# Patient Record
Sex: Female | Born: 1980 | Race: Black or African American | Hispanic: No | Marital: Married | Smoking: Never smoker
Health system: Southern US, Community
[De-identification: ages and names within clinical notes are randomized; demographics above are authoritative.]

## PROBLEM LIST (undated history)

## (undated) ENCOUNTER — Inpatient Hospital Stay (HOSPITAL_COMMUNITY): Payer: Self-pay

## (undated) DIAGNOSIS — I10 Essential (primary) hypertension: Secondary | ICD-10-CM

## (undated) DIAGNOSIS — B999 Unspecified infectious disease: Secondary | ICD-10-CM

## (undated) DIAGNOSIS — IMO0002 Reserved for concepts with insufficient information to code with codable children: Secondary | ICD-10-CM

## (undated) DIAGNOSIS — N390 Urinary tract infection, site not specified: Secondary | ICD-10-CM

## (undated) DIAGNOSIS — D649 Anemia, unspecified: Secondary | ICD-10-CM

## (undated) DIAGNOSIS — D219 Benign neoplasm of connective and other soft tissue, unspecified: Secondary | ICD-10-CM

## (undated) HISTORY — PX: WISDOM TOOTH EXTRACTION: SHX21

---
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
## 1999-01-14 ENCOUNTER — Inpatient Hospital Stay (HOSPITAL_COMMUNITY): Admission: AD | Admit: 1999-01-14 | Discharge: 1999-01-14 | Payer: Self-pay | Admitting: *Deleted

## 1999-02-18 ENCOUNTER — Inpatient Hospital Stay (HOSPITAL_COMMUNITY): Admission: AD | Admit: 1999-02-18 | Discharge: 1999-02-18 | Payer: Self-pay | Admitting: *Deleted

## 1999-03-22 ENCOUNTER — Inpatient Hospital Stay (HOSPITAL_COMMUNITY): Admission: AD | Admit: 1999-03-22 | Discharge: 1999-03-25 | Payer: Self-pay | Admitting: *Deleted

## 2000-10-23 DIAGNOSIS — IMO0002 Reserved for concepts with insufficient information to code with codable children: Secondary | ICD-10-CM

## 2000-10-23 HISTORY — DX: Reserved for concepts with insufficient information to code with codable children: IMO0002

## 2001-07-10 ENCOUNTER — Emergency Department (HOSPITAL_COMMUNITY): Admission: EM | Admit: 2001-07-10 | Discharge: 2001-07-10 | Payer: Self-pay | Admitting: Emergency Medicine

## 2002-05-23 ENCOUNTER — Inpatient Hospital Stay (HOSPITAL_COMMUNITY): Admission: AD | Admit: 2002-05-23 | Discharge: 2002-05-23 | Payer: Self-pay | Admitting: Obstetrics and Gynecology

## 2002-07-01 ENCOUNTER — Other Ambulatory Visit: Admission: RE | Admit: 2002-07-01 | Discharge: 2002-07-01 | Payer: Self-pay | Admitting: Obstetrics and Gynecology

## 2002-08-22 ENCOUNTER — Inpatient Hospital Stay (HOSPITAL_COMMUNITY): Admission: AD | Admit: 2002-08-22 | Discharge: 2002-08-22 | Payer: Self-pay | Admitting: Obstetrics and Gynecology

## 2002-09-01 ENCOUNTER — Inpatient Hospital Stay (HOSPITAL_COMMUNITY): Admission: AD | Admit: 2002-09-01 | Discharge: 2002-09-01 | Payer: Self-pay | Admitting: Obstetrics and Gynecology

## 2002-09-02 ENCOUNTER — Inpatient Hospital Stay (HOSPITAL_COMMUNITY): Admission: AD | Admit: 2002-09-02 | Discharge: 2002-09-02 | Payer: Self-pay | Admitting: Obstetrics and Gynecology

## 2002-11-18 ENCOUNTER — Inpatient Hospital Stay (HOSPITAL_COMMUNITY): Admission: AD | Admit: 2002-11-18 | Discharge: 2002-11-18 | Payer: Self-pay | Admitting: Obstetrics and Gynecology

## 2002-11-20 ENCOUNTER — Inpatient Hospital Stay (HOSPITAL_COMMUNITY): Admission: AD | Admit: 2002-11-20 | Discharge: 2002-11-23 | Payer: Self-pay | Admitting: Obstetrics and Gynecology

## 2003-09-20 ENCOUNTER — Emergency Department (HOSPITAL_COMMUNITY): Admission: AD | Admit: 2003-09-20 | Discharge: 2003-09-20 | Payer: Self-pay | Admitting: Family Medicine

## 2004-01-20 ENCOUNTER — Other Ambulatory Visit: Admission: RE | Admit: 2004-01-20 | Discharge: 2004-01-20 | Payer: Self-pay | Admitting: Obstetrics and Gynecology

## 2005-11-07 ENCOUNTER — Other Ambulatory Visit: Admission: RE | Admit: 2005-11-07 | Discharge: 2005-11-07 | Payer: Self-pay | Admitting: Obstetrics and Gynecology

## 2010-03-22 ENCOUNTER — Inpatient Hospital Stay (HOSPITAL_COMMUNITY): Admission: AD | Admit: 2010-03-22 | Discharge: 2010-03-22 | Payer: Self-pay | Admitting: Obstetrics and Gynecology

## 2010-04-11 ENCOUNTER — Inpatient Hospital Stay (HOSPITAL_COMMUNITY): Admission: AD | Admit: 2010-04-11 | Discharge: 2010-04-11 | Payer: Self-pay | Admitting: Obstetrics and Gynecology

## 2010-04-20 ENCOUNTER — Inpatient Hospital Stay (HOSPITAL_COMMUNITY): Admission: AD | Admit: 2010-04-20 | Discharge: 2010-04-23 | Payer: Self-pay | Admitting: Obstetrics and Gynecology

## 2010-05-15 ENCOUNTER — Emergency Department (HOSPITAL_BASED_OUTPATIENT_CLINIC_OR_DEPARTMENT_OTHER): Admission: EM | Admit: 2010-05-15 | Discharge: 2010-05-15 | Payer: Self-pay | Admitting: Emergency Medicine

## 2011-01-08 LAB — CBC
HCT: 30.4 % — ABNORMAL LOW (ref 36.0–46.0)
HCT: 36.3 % (ref 36.0–46.0)
Hemoglobin: 10.6 g/dL — ABNORMAL LOW (ref 12.0–15.0)
Hemoglobin: 12.2 g/dL (ref 12.0–15.0)
MCH: 30.1 pg (ref 26.0–34.0)
MCH: 29.3 pg (ref 26.0–34.0)
MCHC: 34.8 g/dL (ref 30.0–36.0)
MCHC: 33.7 g/dL (ref 30.0–36.0)
MCV: 86.5 fL (ref 78.0–100.0)
MCV: 86.9 fL (ref 78.0–100.0)
Platelets: 195 10*3/uL (ref 150–400)
Platelets: 238 10*3/uL (ref 150–400)
RBC: 3.51 MIL/uL — ABNORMAL LOW (ref 3.87–5.11)
RBC: 4.18 MIL/uL (ref 3.87–5.11)
RDW: 15.1 % (ref 11.5–15.5)
RDW: 15.6 % — ABNORMAL HIGH (ref 11.5–15.5)
WBC: 11.8 10*3/uL — ABNORMAL HIGH (ref 4.0–10.5)
WBC: 8.5 10*3/uL (ref 4.0–10.5)

## 2011-01-08 LAB — RPR: RPR Ser Ql: NONREACTIVE

## 2011-01-08 LAB — CCBB MATERNAL DONOR DRAW

## 2011-01-09 LAB — WET PREP, GENITAL: Trich, Wet Prep: NONE SEEN

## 2011-01-09 LAB — URINALYSIS, ROUTINE W REFLEX MICROSCOPIC
Bilirubin Urine: NEGATIVE
Glucose, UA: NEGATIVE mg/dL
Ketones, ur: 15 mg/dL — AB
Nitrite: NEGATIVE
Protein, ur: NEGATIVE mg/dL
Specific Gravity, Urine: >1.03 — ABNORMAL HIGH (ref 1.005–1.030)
Urobilinogen, UA: 0.2 mg/dL (ref 0.0–1.0)
pH: 6 (ref 5.0–8.0)

## 2011-01-09 LAB — URINE MICROSCOPIC-ADD ON

## 2011-01-09 LAB — URINE CULTURE: Colony Count: 30000

## 2011-03-10 NOTE — H&P (Signed)
NAME:  Beth Walker, Beth Walker                      ACCOUNT NO.:  [id number]   MEDICAL RECORD NO.:  [id number]                   PATIENT TYPE:  INP   LOCATION:  9163                                 FACILITY:  [facility]   PHYSICIAN:  Crist Fat. Rivard, M.D.              DATE OF BIRTH:  Jan 13, 1981   DATE OF ADMISSION:  11/20/2002  DATE OF DISCHARGE:                                HISTORY & PHYSICAL   HISTORY OF PRESENT ILLNESS:  The patient is a 30 year old gravida 3, para 1,  0, 1, 1 at 29.5 who presented to the office with cervix 3-4 cm, 75%, vertex  and -1 with uterine contractions every 2-3 minutes.  Cervix was 2 cm,  posterior in the office yesterday.   Pregnancy has been remarkable for:  1. Late to care with care beginning at approximately 18 weeks.  2. Chlamydia second trimester with negative test of cure.  3. History of fibroids.   PRENATAL LABORATORY DATA:  Blood type is B positive, Rh antibody negative.  VDRL nonreactive.  Rubella titer positive.  Hepatitis B surface antigen  negative.  Sickle cell test negative.  GC cultures were negative.  Chlamydia  had been positive at 25 weeks with a negative test to cure to follow.  Glucose challenge was negative.  AFP was declined.  HIV was declined.  Follow up RPR at 20 weeks was negative.  EDC of November 30, 2002 was  established by last menstrual period and was in agreement with ultrasound in  the first trimester.  Group B Strep was negative at 36 weeks.   HISTORY OF PRESENT PREGNANCY:  The patient entered care at approximately 16  and 18 weeks.  She had an ultrasound in the first trimester at [facility].  She had negative cultures in June.  She had another ultrasound in  September that showed normal growth and fluid.  The patient had some  cramping at 25 weeks for which she was tested for Chlamydia and fetal  fibronectin was negative; Chlamydia was positive.  Chest x-ray was negative.  She had an ultrasound at 30 weeks for  decreased fetal movement with a good  biophysical profile and good fluid.  The rest of her pregnancy was  essentially uncomplicated.   PAST OBSTETRICAL HISTORY:  In May of 2000 she had a vaginal birth of a female  infant that weighed 7 pounds 2 ounces at 41 weeks.  She was in labor 12  hours.  She has no complications.  She used no pain medication other than  Stadol.  In January 2002 she had a therapeutic termination at [redacted] weeks  gestation without complications.   PAST MEDICAL HISTORY:  In 1999 she had Chlamydia.  She reports the usual  childhood illnesses.  She has occasional yeast infections.  She had a UTI in  June of 2003.   PAST SURGICAL HISTORY:  Surgical history includes the previously noted TAB.  Her only other hospitalization was for childbirth.   ALLERGIES:  The patient has no known medication allergies.   FAMILY HISTORY:  The patient's father and paternal grandfather have  hypertension.  Her paternal uncle and cousin both had aneurysms, and are now  deceased.  Her maternal grandmother had breast cancer.  Her maternal aunt  had cervical cancer; they are both deceased.  Paternal grandfather and  maternal grandmother had strokes.  Maternal uncle had migraine headaches.  Father, paternal grandfather, maternal grandfather and uncles all smoke.   GENETIC HISTORY:  Unremarkable.   SOCIAL HISTORY:  The patient is single.  The father of the baby is not  currently involved.  The patient is high school educated.  She is employed  in [employer].  She is African-American and of the [place] faith.  She has been followed by the certified nurse midwife service at [facility].  She denies any alcohol, drug or tobacco use during this  pregnancy.   PHYSICAL EXAMINATION:  VITAL SIGNS:  Vital signs are stable.  The patient is  afebrile.  HEENT:  Within normal limits.  LUNGS:  Bilateral breath sounds are clear.  HEART:  Regular rate and rhythm without murmur.  BREASTS:   Soft and nontender.  ABDOMEN:  Fundal height is approximately 37 cm.  Estimated fetal weight is  6.5-7 pounds.  Uterine contractions every 4-5 minutes, mild quality.  CERVICAL EXAMINATION:  Three to four centimeters, 75%, vertex and -1 station  with the cervix slightly posterior.  This exam was by Mack Guise at  [facility] office.  EXTREMITIES:  Deep tendon reflexes are 2+ without clonus.  There is a trace  edema noted.   Fetal heart rate is reactive with no decelerations.   IMPRESSION:  1. Intrauterine pregnancy at 38.5 weeks.  2. Early labor.   PLAN:  1. Admit to birth suite per consult with Dr. Estanislado Pandy as attending physician.  2. Routine certified nurse midwife orders.  3. The patient prefers IV pain medication at present.       Renaldo Reel Emilee Hero, C.N.M.                   Crist Fat Rivard, M.D.    Leeanne Mannan  D:  11/21/2002  T:  11/21/2002  Job:  [id number]

## 2012-03-14 ENCOUNTER — Telehealth: Payer: Self-pay | Admitting: Obstetrics and Gynecology

## 2012-03-14 NOTE — Telephone Encounter (Signed)
Beth Walker/AR pt °

## 2012-03-19 ENCOUNTER — Telehealth: Payer: Self-pay

## 2012-03-19 NOTE — Telephone Encounter (Signed)
LM for pt to cb if she still needs an appt. Melody Comas A

## 2012-04-19 ENCOUNTER — Telehealth: Payer: Self-pay | Admitting: Obstetrics and Gynecology

## 2012-04-19 NOTE — Telephone Encounter (Signed)
Triage/pt last Dec. 2012/cht received

## 2012-04-19 NOTE — Telephone Encounter (Signed)
Lm for pt to call back

## 2012-10-28 ENCOUNTER — Ambulatory Visit (INDEPENDENT_AMBULATORY_CARE_PROVIDER_SITE_OTHER): Payer: Managed Care, Other (non HMO) | Admitting: Obstetrics and Gynecology

## 2012-10-28 ENCOUNTER — Encounter: Payer: Self-pay | Admitting: Obstetrics and Gynecology

## 2012-10-28 VITALS — BP 106/72 | Resp 16 | Ht 66.0 in | Wt 174.0 lb

## 2012-10-28 DIAGNOSIS — Z309 Encounter for contraceptive management, unspecified: Secondary | ICD-10-CM

## 2012-10-28 MED ORDER — NORETHIN ACE-ETH ESTRAD-FE 1-20 MG-MCG(24) PO TABS: 1.0000 | ORAL_TABLET | Freq: Every day | ORAL | Status: DC

## 2012-10-28 NOTE — Progress Notes (Signed)
Here for IUD removal secondary to weight gain.  Wants to go on OCPs.  Filed Vitals:   10/28/12 0914  BP: 106/72  Resp: 16    ROS: noncontributory  Pelvic exam:  VULVA: normal appearing vulva with no masses, tenderness or lesions,  VAGINA: normal appearing vagina with normal color and discharge, no lesions, CERVIX: normal appearing cervix without discharge or lesions,  UTERUS: uterus is normal size, shape, consistency and nontender,  ADNEXA: normal adnexa in size, nontender and no masses.  IUD removed without difficulty  A/P AEX next available Rx loestrin 24

## 2012-11-15 ENCOUNTER — Encounter: Payer: Self-pay | Admitting: Obstetrics and Gynecology

## 2012-11-15 ENCOUNTER — Ambulatory Visit: Payer: Managed Care, Other (non HMO) | Admitting: Obstetrics and Gynecology

## 2012-11-15 VITALS — BP 110/60 | HR 68 | Resp 16 | Ht 66.0 in | Wt 174.0 lb

## 2012-11-15 DIAGNOSIS — Z01419 Encounter for gynecological examination (general) (routine) without abnormal findings: Secondary | ICD-10-CM

## 2012-11-15 DIAGNOSIS — Z113 Encounter for screening for infections with a predominantly sexual mode of transmission: Secondary | ICD-10-CM

## 2012-11-15 DIAGNOSIS — Z124 Encounter for screening for malignant neoplasm of cervix: Secondary | ICD-10-CM

## 2012-11-15 MED ORDER — TINIDAZOLE 500 MG PO TABS: 2.0000 g | ORAL_TABLET | Freq: Every day | ORAL | Status: DC

## 2012-11-15 NOTE — Progress Notes (Signed)
Contraception Birth Control Pill  s/p removal of mirena Last pap 10/19/2011 WNL Last Mammo None Last Colonoscopy None Last Dexa Scan None Primary MD None Abuse at Home None  C/o ?d/c  Filed Vitals:   11/15/12 1115  BP: 110/60  Pulse: 68  Resp: 16   ROS: noncontributory  Physical Examination: General appearance - alert, well appearing, and in no distress Neck - supple, no significant adenopathy Chest - clear to auscultation, no wheezes, rales or rhonchi, symmetric air entry Heart - normal rate and regular rhythm Abdomen - soft, nontender, nondistended, no masses or organomegaly Breasts - breasts appear normal, no suspicious masses, no skin or nipple changes or axillary nodes Pelvic - normal external genitalia, vulva, vagina, cervix, uterus and adnexa Back exam - no CVAT Extremities - no edema, redness or tenderness in the calves or thighs  A/P Wet prep - trial of rephresh - neg otherwise (ph inc) Pap today  AEX in 70yr Cont OCPs

## 2012-11-15 NOTE — Addendum Note (Signed)
Addended by: Marla Roe A on: 11/15/2012 03:04 PM   Modules accepted: Orders

## 2012-11-19 LAB — PAP IG, CT-NG, RFX HPV ASCU: GC Probe Amp: NEGATIVE

## 2012-12-24 ENCOUNTER — Telehealth: Payer: Self-pay

## 2012-12-24 NOTE — Telephone Encounter (Signed)
Called pharmacist to ok 90 day supply of Minasttrin 24 FE  W/ enough RF's to last through 10/2013. Melody Comas A

## 2013-04-22 DIAGNOSIS — D219 Benign neoplasm of connective and other soft tissue, unspecified: Secondary | ICD-10-CM | POA: Diagnosis not present

## 2013-04-22 DIAGNOSIS — B951 Streptococcus, group B, as the cause of diseases classified elsewhere: Secondary | ICD-10-CM | POA: Diagnosis present

## 2013-04-22 NOTE — H&P (Signed)
Beth Walker is a 32 y.o. female, [id number], with 1st trimester missed AB, presenting on 04/23/13 for scheduled D&E with Dr. Normand Sloop.  Patient was 11 4/7 weeks by dates, but 7 4/7 weeks by CRL.  She had an initial [place] at her NOB on 04/17/13, where a likely 7 4/7 week IUFD was noted.  Patient elected to repeat the [place] on 04/22/13, where the IUFD was verified.  Patient has had no bleeding or pain.  She was offered options for management of the missed AB, and she elected to proceed with scheduling the D&E on 04/23/13.  She was instructed to remain NPO after MN prior to surgery.   Patient Active Problem List   Diagnosis Date Noted  . Hx of Fibroids in past 04/22/2013  . GBS (group B streptococcus) UTI complicating pregnancy 04/22/2013  Conception on OCPs.  History OB History   Grav Para Term Preterm Abortions TAB SAB Ect Mult Living                2000--SVB at term 2002--TAB at 10 weeks 2004--SVB at term 2011--SVB at term  Medical Hx:  Hx chlamydia in remote past.  Hx fibroids, but none noted on [place] this pregnancy.   Family Hx:  Remarkable for hypertension, diabetes, lung cancer, prostate cancer, post-menopausal breast cancer, ? Ovarian or cervical cancer in a grandmother (patient is unsure of site), stroke.  Social History:  reports that she has never smoked. She does not have any smokeless tobacco history on file. She reports that she does not drink alcohol or use illicit drugs.  Husband is involved and supportive.    Surgical Hx:  Wisdom teeth in 2011, TAB in 2002 at 10 weeks.  ROS:  Denies any significant sx    Exam Physical Exam   BP 120/70, weight 177 Chest clear  Heart RRR without murmur Abd soft, NT Pelvic--deferred at visit on 04/22/13 Ext WNL  Prenatal labs: ABO, Rh:  B+ Antibody:  Neg Rubella:  Immune RPR:   NR HBsAg:   Neg HIV:   NR GBS:   Positive in urine culture 6/5--treated with PCN VK x 7 days for colony count of 20,000. Hgb electrophoresis WNL in previous  pregnancy Hgb 12.6 on 6/5 GC/chlamydia negative from 6/5  Assessment/Plan: 1st trimester IUFD/missed AB--11 4/7 weeks by dates, 7/4/7 weeks by size B+ Desires D&E  Plan: Admitted to [facility] on 04/23/13 for outpatient D&E with Dr. Normand Sloop Routine [facility] pre-op orders R&B of D&E have been reviewed with the patient at her office visit on 04/22/13, including bleeding, infection, and damage to other organs.  Patient and husband seem to understand these risks and wish to proceed with D&E. Support to patient for her loss.  [name] 04/22/2013, 11:44 PM  /Date of Initial H&P:04/22/13  History reviewed, patient examined, no change in status, stable for surgery.

## 2013-04-23 ENCOUNTER — Ambulatory Visit (HOSPITAL_COMMUNITY)
Admission: RE | Admit: 2013-04-23 | Discharge: 2013-04-23 | Disposition: A | Discharge status: Home / Self Care | Payer: Managed Care, Other (non HMO) | Source: Ambulatory Visit | Attending: Obstetrics and Gynecology | Admitting: Obstetrics and Gynecology

## 2013-04-23 ENCOUNTER — Encounter (HOSPITAL_COMMUNITY): Payer: Self-pay | Admitting: *Deleted

## 2013-04-23 ENCOUNTER — Encounter (HOSPITAL_COMMUNITY)
Admission: RE | Disposition: A | Discharge status: Home / Self Care | Payer: Self-pay | Source: Ambulatory Visit | Attending: Obstetrics and Gynecology

## 2013-04-23 ENCOUNTER — Ambulatory Visit (HOSPITAL_COMMUNITY): Payer: Managed Care, Other (non HMO)

## 2013-04-23 ENCOUNTER — Encounter (HOSPITAL_COMMUNITY): Payer: Self-pay | Admitting: Anesthesiology

## 2013-04-23 ENCOUNTER — Ambulatory Visit (HOSPITAL_COMMUNITY): Payer: Managed Care, Other (non HMO) | Admitting: Anesthesiology

## 2013-04-23 DIAGNOSIS — D219 Benign neoplasm of connective and other soft tissue, unspecified: Secondary | ICD-10-CM | POA: Diagnosis not present

## 2013-04-23 DIAGNOSIS — B951 Streptococcus, group B, as the cause of diseases classified elsewhere: Secondary | ICD-10-CM | POA: Diagnosis present

## 2013-04-23 DIAGNOSIS — O021 Missed abortion: Secondary | ICD-10-CM | POA: Insufficient documentation

## 2013-04-23 DIAGNOSIS — O2340 Unspecified infection of urinary tract in pregnancy, unspecified trimester: Secondary | ICD-10-CM | POA: Diagnosis present

## 2013-04-23 HISTORY — DX: Benign neoplasm of connective and other soft tissue, unspecified: D21.9

## 2013-04-23 HISTORY — PX: DILATION AND EVACUATION: SHX1459

## 2013-04-23 HISTORY — DX: Reserved for concepts with insufficient information to code with codable children: IMO0002

## 2013-04-23 HISTORY — DX: Urinary tract infection, site not specified: N39.0

## 2013-04-23 LAB — CBC
HCT: 36.9 % (ref 36.0–46.0)
RBC: 4.34 MIL/uL (ref 3.87–5.11)
RDW: 12.3 % (ref 11.5–15.5)
WBC: 7.5 10*3/uL (ref 4.0–10.5)

## 2013-04-23 SURGERY — DILATION AND EVACUATION, UTERUS
Anesthesia: Monitor Anesthesia Care | Site: Uterus | Wound class: Clean Contaminated

## 2013-04-23 MED ORDER — MIDAZOLAM HCL 5 MG/5ML IJ SOLN
INTRAMUSCULAR | Status: DC | PRN
  Administered 2013-04-23: 2 mg via INTRAVENOUS

## 2013-04-23 MED ORDER — METOCLOPRAMIDE HCL 5 MG/ML IJ SOLN: 10.0000 mg | Freq: Once | INTRAMUSCULAR | Status: AC

## 2013-04-23 MED ORDER — FENTANYL CITRATE 0.05 MG/ML IJ SOLN: 25.0000 ug | INTRAMUSCULAR | Status: DC | PRN

## 2013-04-23 MED ORDER — KETOROLAC TROMETHAMINE 30 MG/ML IJ SOLN
INTRAMUSCULAR | Status: DC | PRN
  Administered 2013-04-23: 30 mg via INTRAVENOUS

## 2013-04-23 MED ORDER — IBUPROFEN 600 MG PO TABS: 600.0000 mg | ORAL_TABLET | Freq: Four times a day (QID) | ORAL | Status: DC | PRN

## 2013-04-23 MED ORDER — SILVER NITRATE-POT NITRATE 75-25 % EX MISC
CUTANEOUS | Status: DC | PRN
  Administered 2013-04-23: 2

## 2013-04-23 MED ORDER — NORETHIN-ETH ESTRAD-FE BIPHAS 1 MG-10 MCG / 10 MCG PO TABS: 1.0000 | ORAL_TABLET | Freq: Every day | ORAL | Status: DC

## 2013-04-23 MED ORDER — ONDANSETRON HCL 4 MG/2ML IJ SOLN
INTRAMUSCULAR | Status: DC | PRN
  Administered 2013-04-23: 4 mg via INTRAVENOUS

## 2013-04-23 MED ORDER — PROPOFOL INFUSION 10 MG/ML OPTIME
INTRAVENOUS | Status: DC | PRN
  Administered 2013-04-23: 100 ug/kg/min via INTRAVENOUS

## 2013-04-23 MED ORDER — METOCLOPRAMIDE HCL 5 MG/ML IJ SOLN
INTRAMUSCULAR | Status: AC
  Administered 2013-04-23: 10 mg via INTRAVENOUS
  Filled 2013-04-23: qty 2

## 2013-04-23 MED ORDER — FENTANYL CITRATE 0.05 MG/ML IJ SOLN
INTRAMUSCULAR | Status: DC | PRN
  Administered 2013-04-23: 50 ug via INTRAVENOUS

## 2013-04-23 MED ORDER — LIDOCAINE HCL (CARDIAC) 20 MG/ML IV SOLN
INTRAVENOUS | Status: DC | PRN
  Administered 2013-04-23: 50 mg via INTRAVENOUS

## 2013-04-23 MED ORDER — LIDOCAINE HCL 1 % IJ SOLN
INTRAMUSCULAR | Status: DC | PRN
  Administered 2013-04-23: 20 mL

## 2013-04-23 MED ORDER — DOXYCYCLINE HYCLATE 50 MG PO CAPS: 100.0000 mg | ORAL_CAPSULE | Freq: Two times a day (BID) | ORAL | Status: DC

## 2013-04-23 MED ORDER — FENTANYL CITRATE 0.05 MG/ML IJ SOLN
INTRAMUSCULAR | Status: AC
  Filled 2013-04-23: qty 2

## 2013-04-23 MED ORDER — MIDAZOLAM HCL 2 MG/2ML IJ SOLN
INTRAMUSCULAR | Status: AC
  Filled 2013-04-23: qty 2

## 2013-04-23 MED ORDER — LACTATED RINGERS IV SOLN
INTRAVENOUS | Status: DC
  Administered 2013-04-23 (×3): via INTRAVENOUS

## 2013-04-23 MED ORDER — DOXYCYCLINE HYCLATE 50 MG PO CAPS: 100.0000 mg | ORAL_CAPSULE | Freq: Two times a day (BID) | ORAL | Status: AC

## 2013-04-23 SURGICAL SUPPLY — 18 items
CATH ROBINSON RED A/P 16FR (CATHETERS) ×2 IMPLANT
CLOTH BEACON ORANGE TIMEOUT ST (SAFETY) ×2 IMPLANT
DECANTER SPIKE VIAL GLASS SM (MISCELLANEOUS) ×2 IMPLANT
GLOVE BIOGEL PI IND STRL 7.0 (GLOVE) ×6 IMPLANT
GLOVE BIOGEL PI INDICATOR 7.0 (GLOVE) ×6
GLOVE NEODERM STER SZ 7 (GLOVE) ×4 IMPLANT
GLOVE SURG SS PI 7.0 STRL IVOR (GLOVE) ×8 IMPLANT
GOWN STRL REIN XL XLG (GOWN DISPOSABLE) ×6 IMPLANT
KIT BERKELEY 1ST TRIMESTER 3/8 (MISCELLANEOUS) ×2 IMPLANT
NEEDLE SPNL 22GX3.5 QUINCKE BK (NEEDLE) ×2 IMPLANT
NS IRRIG 1000ML POUR BTL (IV SOLUTION) ×2 IMPLANT
PACK VAGINAL MINOR WOMEN LF (CUSTOM PROCEDURE TRAY) ×2 IMPLANT
PAD OB MATERNITY 4.3X12.25 (PERSONAL CARE ITEMS) ×2 IMPLANT
PAD PREP 24X48 CUFFED NSTRL (MISCELLANEOUS) ×6 IMPLANT
SET BERKELEY SUCTION TUBING (SUCTIONS) ×2 IMPLANT
SYR 20CC LL (SYRINGE) ×2 IMPLANT
TOWEL OR 17X24 6PK STRL BLUE (TOWEL DISPOSABLE) ×4 IMPLANT
VACURETTE 8 RIGID CVD (CANNULA) ×2 IMPLANT

## 2013-04-23 NOTE — OR Nursing (Signed)
Dr. Joya Gaskins called at 0930 for an estimated time of arrival.  Surgeon stated she was unaware of case being scheduled.  Stated she will be available around 1130. OR Desk aware, anesthesia aware.

## 2013-04-23 NOTE — Anesthesia Postprocedure Evaluation (Signed)
  Anesthesia Post-op Note  Anesthesia Post Note  Patient: Beth Walker  Procedure(s) Performed: Procedure(s) (LRB): DILATATION AND EVACUATION (N/A)  Anesthesia type: MAC  Patient location: PACU  Post pain: Pain level controlled  Post assessment: Post-op Vital signs reviewed  Last Vitals:  Filed Vitals:   04/23/13 1315  BP:   Pulse: 71  Temp: 36.6 C  Resp: 16    Post vital signs: Reviewed  Level of consciousness: sedated  Complications: No apparent anesthesia complications

## 2013-04-23 NOTE — Progress Notes (Signed)
Spoke with Dr Cristela Blue, ok for patient to leave in contact lens for this D & E (MAB) procedure.  Patient is B positive blood type.

## 2013-04-23 NOTE — Transfer of Care (Signed)
Immediate Anesthesia Transfer of Care Note  Patient: Beth Walker  Procedure(s) Performed: Procedure(s): DILATATION AND EVACUATION (N/A)  Patient Location: PACU  Anesthesia Type:MAC  Level of Consciousness: awake, alert  and oriented  Airway & Oxygen Therapy: Patient Spontanous Breathing  Post-op Assessment: Report given to PACU RN and Post -op Vital signs reviewed and stable  Post vital signs: Reviewed and stable  Complications: No apparent anesthesia complications

## 2013-04-23 NOTE — Op Note (Signed)
Pre op DX :  missed abortion Postoperative Diagnosis: same Procedure:  dilation and evacuation Anesthesia:  MAC and local Surgeon:  Dr. Normand Sloop Asst : none Complications : none Procedure in detail: The patient was taken to the operating room where she was given MAC anesthesia. She was placed in dorsal lithotomy position and prepped and draped in normal sterile fashion. In and out catheter was used to drain the bladder. This was examined and noted to have a 10 week size uterus with no adnexal masses. A bivalve was placed into the vagina. A tenaculum was placed on the cervix. The cervix was infiltrated with 20 cc 1% lidocaine paracervical block. The cervix then dilated with dilators up to 23.  A size 8 suction curettage was placed into the uterine cavity. A scant amount of products of conception was seen. The suction curettage was removed when a gritty texture was noted. A sharp curette was done along all walls  of the uterus. The suction curet was placed back into the uterine cavity. No further products of conception were obtained. Sponge lap and needle counts were correct. This was done with US guidance.   Patient back in stable condition. The patient understood to be the risks to be, but not  limited to,  Bleeding,  Infection,  damage to internal organs by perforation of the uterus and Asherman syndrome (scarring in the uterus) leading to infertility.

## 2013-04-23 NOTE — Anesthesia Preprocedure Evaluation (Addendum)
Anesthesia Evaluation Anesthesia Physical Anesthesia Plan  ASA: II  Anesthesia Plan: MAC   Post-op Pain Management:    Induction: Intravenous  Airway Management Planned: LMA, Mask and Natural Airway  Additional Equipment:   Intra-op Plan:   Post-operative Plan:   Informed Consent: I have reviewed the patients History and Physical, chart, labs and discussed the procedure including the risks, benefits and alternatives for the proposed anesthesia with the patient or authorized representative who has indicated his/her understanding and acceptance.   Dental Advisory Given  Plan Discussed with: CRNA and Surgeon  Anesthesia Plan Comments:         Anesthesia Quick Evaluation

## 2013-04-24 ENCOUNTER — Encounter (HOSPITAL_COMMUNITY): Payer: Self-pay | Admitting: Obstetrics and Gynecology

## 2014-08-24 ENCOUNTER — Encounter (HOSPITAL_COMMUNITY): Payer: Self-pay | Admitting: Obstetrics and Gynecology

## 2014-11-26 ENCOUNTER — Encounter (HOSPITAL_COMMUNITY): Payer: Self-pay | Admitting: Emergency Medicine

## 2014-11-26 ENCOUNTER — Emergency Department (HOSPITAL_COMMUNITY)
Admission: EM | Admit: 2014-11-26 | Discharge: 2014-11-26 | Disposition: A | Discharge status: Home / Self Care | Payer: No Typology Code available for payment source | Attending: Emergency Medicine | Admitting: Emergency Medicine

## 2014-11-26 DIAGNOSIS — Z86018 Personal history of other benign neoplasm: Secondary | ICD-10-CM | POA: Insufficient documentation

## 2014-11-26 DIAGNOSIS — R42 Dizziness and giddiness: Secondary | ICD-10-CM | POA: Insufficient documentation

## 2014-11-26 DIAGNOSIS — R51 Headache: Secondary | ICD-10-CM | POA: Insufficient documentation

## 2014-11-26 DIAGNOSIS — I10 Essential (primary) hypertension: Secondary | ICD-10-CM | POA: Diagnosis not present

## 2014-11-26 DIAGNOSIS — Z793 Long term (current) use of hormonal contraceptives: Secondary | ICD-10-CM | POA: Insufficient documentation

## 2014-11-26 DIAGNOSIS — H5500 Unspecified nystagmus: Secondary | ICD-10-CM | POA: Diagnosis not present

## 2014-11-26 DIAGNOSIS — Z79899 Other long term (current) drug therapy: Secondary | ICD-10-CM | POA: Diagnosis not present

## 2014-11-26 DIAGNOSIS — Z87440 Personal history of urinary (tract) infections: Secondary | ICD-10-CM | POA: Insufficient documentation

## 2014-11-26 LAB — URINALYSIS, ROUTINE W REFLEX MICROSCOPIC
Bilirubin Urine: NEGATIVE
GLUCOSE, UA: NEGATIVE mg/dL
Hgb urine dipstick: NEGATIVE
Ketones, ur: NEGATIVE mg/dL
Leukocytes, UA: NEGATIVE
NITRITE: NEGATIVE
Protein, ur: NEGATIVE mg/dL
SPECIFIC GRAVITY, URINE: 1.019 (ref 1.005–1.030)
UROBILINOGEN UA: 0.2 mg/dL (ref 0.0–1.0)
pH: 6 (ref 5.0–8.0)

## 2014-11-26 LAB — PREGNANCY, URINE: PREG TEST UR: NEGATIVE

## 2014-11-26 MED ORDER — HYDROCHLOROTHIAZIDE 25 MG PO TABS: 25.0000 mg | ORAL_TABLET | Freq: Every day | ORAL | Status: DC

## 2014-11-26 MED ORDER — MECLIZINE HCL 25 MG PO TABS: 25.0000 mg | ORAL_TABLET | Freq: Three times a day (TID) | ORAL | Status: DC | PRN

## 2014-11-26 NOTE — ED Provider Notes (Signed)
CSN: [id number]     Arrival date & time 11/26/14  0935 History   First MD Initiated Contact with Patient 11/26/14 1127     Chief Complaint  Patient presents with  . Dizziness  . Hypertension     (Consider location/radiation/quality/duration/timing/severity/associated sxs/prior Treatment) Patient is a 34 y.o. female presenting with dizziness and hypertension. The history is provided by the patient.  Dizziness Quality:  Head spinning Associated symptoms: headaches   Associated symptoms: no chest pain, no diarrhea, no nausea, no shortness of breath and no vomiting   Hypertension Associated symptoms include headaches. Pertinent negatives include no chest pain, no abdominal pain and no shortness of breath.   patient's been having episodes of feeling the room spell around for the last few days. States his been somewhat constant. States she also somewhat feels wavy feeling. She has a dull slight headache to the top of her head. She states feels slightly strange in the back of her head. No ears ringing. No confusion. No fevers. No chest pain. She states she's felt a little nausea with it. States she went to see the clinic at work and was found to have her blood pressure elevated. She's had a previous episode of it elevated the past but states was rechecked later and was normal. She does not think she is pregnant, but is off birth control and has been having sex. No visual changes. No double vision. No previous episodes of vertigo. No recent infections. No chest pain or trouble breathing. No change in her urination.  Past Medical History  Diagnosis Date  . UTI (lower urinary tract infection)     just finished abx for uti - 7 day treatment  . SVD (spontaneous vaginal delivery)     x 3  . Medical history non-contributory   . Termination of pregnancy 2002    x 1  . Fibroids     history   Past Surgical History  Procedure Laterality Date  . Wisdom tooth extraction    . Dilation and evacuation N/A  04/23/2013    Procedure: DILATATION AND EVACUATION;  Surgeon: Betsy Coder, MD;  Location: [facility];  Service: Gynecology;  Laterality: N/A;   No family history on file. History  Substance Use Topics  . Smoking status: Never Smoker   . Smokeless tobacco: Never Used  . Alcohol Use: Yes     Comment: socially but none with pregnancy   OB History    Gravida Para Term Preterm AB TAB SAB Ectopic Multiple Living   1              Review of Systems  Constitutional: Negative for activity change and appetite change.  Eyes: Negative for pain.  Respiratory: Negative for chest tightness and shortness of breath.   Cardiovascular: Negative for chest pain and leg swelling.  Gastrointestinal: Negative for nausea, vomiting, abdominal pain and diarrhea.  Genitourinary: Negative for flank pain.  Musculoskeletal: Negative for back pain and neck stiffness.  Skin: Negative for rash.  Neurological: Positive for dizziness, light-headedness and headaches. Negative for weakness and numbness.  Psychiatric/Behavioral: Negative for behavioral problems.      Allergies  Review of patient's allergies indicates no known allergies.  Home Medications   Prior to Admission medications   Medication Sig Start Date End Date Taking? Authorizing Provider  Norethindrone-Ethinyl Estradiol-Fe Biphas (LO LOESTRIN FE) 1 MG-10 MCG / 10 MCG tablet Take 1 tablet by mouth daily. 04/23/13  Yes Naima Dillard, MD  hydrochlorothiazide (HYDRODIURIL) 25 MG tablet  Take 1 tablet (25 mg total) by mouth daily. 11/26/14   Jasper Riling. Simone Rodenbeck, MD  ibuprofen (ADVIL,MOTRIN) 600 MG tablet Take 1 tablet (600 mg total) by mouth every 6 (six) hours as needed for pain. Patient not taking: Reported on 11/26/2014 04/23/13   Crawford Givens, MD  meclizine (ANTIVERT) 25 MG tablet Take 1 tablet (25 mg total) by mouth 3 (three) times daily as needed for dizziness. 11/26/14   Jasper Riling. Marris Frontera, MD   BP 144/83 mmHg  Pulse 74  Temp(Src) 97.9 F (36.6 C) (Oral)   Resp 19  SpO2 100%  LMP 11/04/2014  Breastfeeding? Unknown Physical Exam  Constitutional: She is oriented to person, place, and time. She appears well-developed and well-nourished.  HENT:  Head: Normocephalic and atraumatic.  Bilateral TMs normal.  Eyes: EOM are normal. Pupils are equal, round, and reactive to light.  Nystagmus with gaze to the right.  Neck: Normal range of motion. Neck supple.  Cardiovascular: Normal rate and regular rhythm.   Pulmonary/Chest: Effort normal and breath sounds normal.  Abdominal: Soft. There is no tenderness.  Musculoskeletal: Normal range of motion.  Neurological: She is alert and oriented to person, place, and time. No cranial nerve deficit.  Normal coordination. Finger-nose intact bilaterally. Some nystagmus with gaze to the right.  Skin: Skin is warm and dry.  Psychiatric: She has a normal mood and affect. Her speech is normal.  Nursing note and vitals reviewed.   ED Course  Procedures (including critical care time) Labs Review Labs Reviewed  URINALYSIS, ROUTINE W REFLEX MICROSCOPIC - Abnormal; Notable for the following:    APPearance CLOUDY (*)    All other components within normal limits  PREGNANCY, URINE    Imaging Review No results found.   EKG Interpretation None      MDM   Final diagnoses:  Vertigo  Essential hypertension    Patient with vertigo. Appears to be peripheral. Also has a mild hypertension. It was high enough that I think it   deserves to be started hydrochlorothiazide and will follow-up with PCP. Doubt a central cause of vertigo or clear association between the vertigo and hypertension. Will discharge home.   Jasper Riling. Alvino Chapel, MD 11/26/14 1316

## 2014-11-26 NOTE — ED Notes (Signed)
Pt states she has been having intermittent dizzy spells since Monday.  States that spells happen in morning and night.  States that she had another dizzy spell this morning at work and the nurse at her job took her blood pressure and told her to come to the ER because her BP was 148/110.

## 2014-11-26 NOTE — ED Notes (Signed)
AVS explained in detail, no other questions/concerns. Given follow up for PCP with [facility]. A&Ox4. Ambulatory with steady gait.

## 2014-11-26 NOTE — Discharge Instructions (Signed)
Hypertension °Hypertension, commonly called high blood pressure, is when the force of blood pumping through your arteries is too strong. Your arteries are the blood vessels that carry blood from your heart throughout your body. A blood pressure reading consists of a higher number over a lower number, such as 110/72. The higher number (systolic) is the pressure inside your arteries when your heart pumps. The lower number (diastolic) is the pressure inside your arteries when your heart relaxes. Ideally you want your blood pressure below 120/80. °Hypertension forces your heart to work harder to pump blood. Your arteries may become narrow or stiff. Having hypertension puts you at risk for heart disease, stroke, and other problems.  °RISK FACTORS °Some risk factors for high blood pressure are controllable. Others are not.  °Risk factors you cannot control include:  °· Race. You may be at higher risk if you are African American. °· Age. Risk increases with age. °· Gender. Men are at higher risk than women before age 45 years. After age 65, women are at higher risk than men. °Risk factors you can control include: °· Not getting enough exercise or physical activity. °· Being overweight. °· Getting too much fat, sugar, calories, or salt in your diet. °· Drinking too much alcohol. °SIGNS AND SYMPTOMS °Hypertension does not usually cause signs or symptoms. Extremely high blood pressure (hypertensive crisis) may cause headache, anxiety, shortness of breath, and nosebleed. °DIAGNOSIS  °To check if you have hypertension, your health care provider will measure your blood pressure while you are seated, with your arm held at the level of your heart. It should be measured at least twice using the same arm. Certain conditions can cause a difference in blood pressure between your right and left arms. A blood pressure reading that is higher than normal on one occasion does not mean that you need treatment. If one blood pressure reading  is high, ask your health care provider about having it checked again. °TREATMENT  °Treating high blood pressure includes making lifestyle changes and possibly taking medicine. Living a healthy lifestyle can help lower high blood pressure. You may need to change some of your habits. °Lifestyle changes may include: °· Following the DASH diet. This diet is high in fruits, vegetables, and whole grains. It is low in salt, red meat, and added sugars. °· Getting at least 2½ hours of brisk physical activity every week. °· Losing weight if necessary. °· Not smoking. °· Limiting alcoholic beverages. °· Learning ways to reduce stress. ° If lifestyle changes are not enough to get your blood pressure under control, your health care provider may prescribe medicine. You may need to take more than one. Work closely with your health care provider to understand the risks and benefits. °HOME CARE INSTRUCTIONS °· Have your blood pressure rechecked as directed by your health care provider.   °· Take medicines only as directed by your health care provider. Follow the directions carefully. Blood pressure medicines must be taken as prescribed. The medicine does not work as well when you skip doses. Skipping doses also puts you at risk for problems.   °· Do not smoke.   °· Monitor your blood pressure at home as directed by your health care provider.  °SEEK MEDICAL CARE IF:  °· You think you are having a reaction to medicines taken. °· You have recurrent headaches or feel dizzy. °· You have swelling in your ankles. °· You have trouble with your vision. °SEEK IMMEDIATE MEDICAL CARE IF: °· You develop a severe headache or confusion. °·   You have unusual weakness, numbness, or feel faint.  You have severe chest or abdominal pain.  You vomit repeatedly.  You have trouble breathing. MAKE SURE YOU:   Understand these instructions.  Will watch your condition.  Will get help right away if you are not doing well or get worse. Document  Released: 10/09/2005 Document Revised: 02/23/2014 Document Reviewed: 08/01/2013 [facility] Patient Information 2015 [place], [place]. This information is not intended to replace advice given to you by your health care provider. Make sure you discuss any questions you have with your health care provider.  Vertigo Vertigo means you feel like you or your surroundings are moving when they are not. Vertigo can be dangerous if it occurs when you are at work, driving, or performing difficult activities.  CAUSES  Vertigo occurs when there is a conflict of signals sent to your brain from the visual and sensory systems in your body. There are many different causes of vertigo, including:  Infections, especially in the inner ear.  A bad reaction to a drug or misuse of alcohol and medicines.  Withdrawal from drugs or alcohol.  Rapidly changing positions, such as lying down or rolling over in bed.  A migraine headache.  Decreased blood flow to the brain.  Increased pressure in the brain from a head injury, infection, tumor, or bleeding. SYMPTOMS  You may feel as though the world is spinning around or you are falling to the ground. Because your balance is upset, vertigo can cause nausea and vomiting. You may have involuntary eye movements (nystagmus). DIAGNOSIS  Vertigo is usually diagnosed by physical exam. If the cause of your vertigo is unknown, your caregiver may perform imaging tests, such as an MRI scan (magnetic resonance imaging). TREATMENT  Most cases of vertigo resolve on their own, without treatment. Depending on the cause, your caregiver may prescribe certain medicines. If your vertigo is related to body position issues, your caregiver may recommend movements or procedures to correct the problem. In rare cases, if your vertigo is caused by certain inner ear problems, you may need surgery. HOME CARE INSTRUCTIONS   Follow your caregiver's instructions.  Avoid driving.  Avoid operating heavy  machinery.  Avoid performing any tasks that would be dangerous to you or others during a vertigo episode.  Tell your caregiver if you notice that certain medicines seem to be causing your vertigo. Some of the medicines used to treat vertigo episodes can actually make them worse in some people. SEEK IMMEDIATE MEDICAL CARE IF:   Your medicines do not relieve your vertigo or are making it worse.  You develop problems with talking, walking, weakness, or using your arms, hands, or legs.  You develop severe headaches.  Your nausea or vomiting continues or gets worse.  You develop visual changes.  A family member notices behavioral changes.  Your condition gets worse. MAKE SURE YOU:  Understand these instructions.  Will watch your condition.  Will get help right away if you are not doing well or get worse. Document Released: 07/19/2005 Document Revised: 01/01/2012 Document Reviewed: 04/27/2011 [facility] Patient Information 2015 [place], [place]. This information is not intended to replace advice given to you by your health care provider. Make sure you discuss any questions you have with your health care provider.

## 2014-11-27 ENCOUNTER — Encounter (HOSPITAL_COMMUNITY): Payer: Self-pay | Admitting: Emergency Medicine

## 2014-11-27 ENCOUNTER — Emergency Department (HOSPITAL_COMMUNITY)
Admission: EM | Admit: 2014-11-27 | Discharge: 2014-11-27 | Disposition: A | Discharge status: Home / Self Care | Payer: No Typology Code available for payment source | Attending: Emergency Medicine | Admitting: Emergency Medicine

## 2014-11-27 DIAGNOSIS — I10 Essential (primary) hypertension: Secondary | ICD-10-CM | POA: Insufficient documentation

## 2014-11-27 DIAGNOSIS — R42 Dizziness and giddiness: Secondary | ICD-10-CM | POA: Insufficient documentation

## 2014-11-27 DIAGNOSIS — R51 Headache: Secondary | ICD-10-CM | POA: Diagnosis not present

## 2014-11-27 DIAGNOSIS — Z793 Long term (current) use of hormonal contraceptives: Secondary | ICD-10-CM | POA: Diagnosis not present

## 2014-11-27 DIAGNOSIS — Z79899 Other long term (current) drug therapy: Secondary | ICD-10-CM | POA: Insufficient documentation

## 2014-11-27 DIAGNOSIS — Z87440 Personal history of urinary (tract) infections: Secondary | ICD-10-CM | POA: Diagnosis not present

## 2014-11-27 DIAGNOSIS — Z8742 Personal history of other diseases of the female genital tract: Secondary | ICD-10-CM | POA: Diagnosis not present

## 2014-11-27 DIAGNOSIS — R519 Headache, unspecified: Secondary | ICD-10-CM

## 2014-11-27 LAB — I-STAT CHEM 8, ED
BUN: 8 mg/dL (ref 6–23)
CALCIUM ION: 1.22 mmol/L (ref 1.12–1.23)
Chloride: 100 mmol/L (ref 96–112)
Creatinine, Ser: 0.7 mg/dL (ref 0.50–1.10)
GLUCOSE: 99 mg/dL (ref 70–99)
HEMATOCRIT: 45 % (ref 36.0–46.0)
HEMOGLOBIN: 15.3 g/dL — AB (ref 12.0–15.0)
POTASSIUM: 4.1 mmol/L (ref 3.5–5.1)
SODIUM: 137 mmol/L (ref 135–145)
TCO2: 24 mmol/L (ref 0–100)

## 2014-11-27 MED ORDER — LORAZEPAM 2 MG/ML IJ SOLN
1.0000 mg | Freq: Once | INTRAMUSCULAR | Status: AC
  Administered 2014-11-27: 1 mg via INTRAVENOUS
  Filled 2014-11-27: qty 1

## 2014-11-27 MED ORDER — ONDANSETRON HCL 4 MG/2ML IJ SOLN
4.0000 mg | Freq: Once | INTRAMUSCULAR | Status: AC
  Administered 2014-11-27: 4 mg via INTRAVENOUS
  Filled 2014-11-27: qty 2

## 2014-11-27 MED ORDER — SODIUM CHLORIDE 0.9 % IV BOLUS (SEPSIS)
1000.0000 mL | Freq: Once | INTRAVENOUS | Status: AC
  Administered 2014-11-27: 1000 mL via INTRAVENOUS

## 2014-11-27 MED ORDER — LORAZEPAM 1 MG PO TABS: 1.0000 mg | ORAL_TABLET | Freq: Three times a day (TID) | ORAL | Status: DC | PRN

## 2014-11-27 NOTE — Discharge Instructions (Signed)
Read the information below.  Use the prescribed medication as directed.  Please discuss all new medications with your pharmacist.  You may return to the Emergency Department at any time for worsening condition or any new symptoms that concern you.  If there is any possibility that you might be pregnant or if you are nursing, please let your health care provider know and discuss this with the pharmacist to ensure medication safety.    You are having a headache. No specific cause was found today for your headache. It may have been a migraine or other cause of headache. Stress, anxiety, fatigue, and depression are common triggers for headaches. Your headache today does not appear to be life-threatening or require hospitalization, but often the exact cause of headaches is not determined in the emergency department. Therefore, follow-up with your doctor is very important to find out what may have caused your headache, and whether or not you need any further diagnostic testing or treatment. Sometimes headaches can appear benign (not harmful), but then more serious symptoms can develop which should prompt an immediate re-evaluation by your doctor or the emergency department. SEEK MEDICAL ATTENTION IF: You develop possible problems with medications prescribed.  The medications don't resolve your headache, if it recurs , or if you have multiple episodes of vomiting or can't take fluids. You have a change from the usual headache. RETURN IMMEDIATELY IF you develop a sudden, severe headache or confusion, become poorly responsive or faint, develop a fever above 100.52F or problem breathing, have a change in speech, vision, swallowing, or understanding, or develop new weakness, numbness, tingling, incoordination, or have a seizure.   Your exam shows you have had an episode of vertigo, which causes a false sense of movement such as a spinning feeling or walls that seem to move.  Most vertigo is caused by a (usually  temporary) problem in the inner ear. Rarely, the back part of the brain can cause vertigo (some mini-strokes / TIA's / strokes), but it appears to be a low risk cause for you at this time. It is important to follow-up with your doctor however, to see if you need further testing.  Do not drive or participate in potentially dangerous activities requiring balance unless off meds (not drowsy) and the vertigo has resolved. Most of the time benign vertigo is much better after a few days. However, mild unsteadiness may last for up to 3 months in some patients. An MRI scan or other special tests to evaluate your hearing and balance may be needed if the vertigo does not improve or returns in the future. RETURN IMMEDIATELY IF YOU HAVE ANY OF THE FOLLOWING (call 911): Increasing vertigo, earache, ear drainage, or loss of hearing.  Severe headache, blurred or double vision, or trouble walking.  Fainting or poorly responsive, extreme weakness, chest pain, or palpitations.  Fever, persistent vomiting, or dehydration.  Numbness, tingling, incoordination, or weakness of the limbs.  Change in speech, vision, swallowing, understanding, or other concerns.

## 2014-11-27 NOTE — ED Notes (Signed)
Pt was seen here yesterday for same symptoms and diagnosed with vertigo. Since then pt has not tried taking the prescription medications given to treat the vertigo.

## 2014-11-27 NOTE — ED Notes (Signed)
Bed: WA04 Expected date:  Expected time:  Means of arrival:  Comments: ems 

## 2014-11-27 NOTE — ED Provider Notes (Signed)
CSN: [id number]     Arrival date & time 11/27/14  1112 History   First MD Initiated Contact with Patient 11/27/14 1117     Chief Complaint  Patient presents with  . Dizziness  . Headache     (Consider location/radiation/quality/duration/timing/severity/associated sxs/prior Treatment) The history is provided by the patient.     Patient seen in ED yesterday for vertigo and HTN presents today with dizziness, lightheadedness, and headache.  States she was feeling better last night and therefore took her HTN medication but no meclizine.  States this morning she developed a gradually worsening throbbing headache, 6/10, located in her forehead and occiput, similar to other headaches she has had in the past, not the worst headache of her life.  States she developed dizziness, nausea, and sweating this morning, also felt lightheaded like she was going to pass out.  The dizziness (spinning) occurs when she moves her head in any direction.  She has not taken any meclizine.  Denies fevers, recent illness, URI symptoms, ear discharge, focal neurologic deficits.  Denies head trauma.  LMP 11/04/14 was normal and on time.    Past Medical History  Diagnosis Date  . UTI (lower urinary tract infection)     just finished abx for uti - 7 day treatment  . SVD (spontaneous vaginal delivery)     x 3  . Medical history non-contributory   . Termination of pregnancy 2002    x 1  . Fibroids     history   Past Surgical History  Procedure Laterality Date  . Wisdom tooth extraction    . Dilation and evacuation N/A 04/23/2013    Procedure: DILATATION AND EVACUATION;  Surgeon: Betsy Coder, MD;  Location: [facility];  Service: Gynecology;  Laterality: N/A;   No family history on file. History  Substance Use Topics  . Smoking status: Never Smoker   . Smokeless tobacco: Never Used  . Alcohol Use: Yes     Comment: socially but none with pregnancy   OB History    Gravida Para Term Preterm AB TAB SAB Ectopic  Multiple Living   1              Review of Systems  All other systems reviewed and are negative.     Allergies  Review of patient's allergies indicates no known allergies.  Home Medications   Prior to Admission medications   Medication Sig Start Date End Date Taking? Authorizing Provider  hydrochlorothiazide (HYDRODIURIL) 25 MG tablet Take 1 tablet (25 mg total) by mouth daily. 11/26/14  Yes Jasper Riling. Pickering, MD  Norethindrone-Ethinyl Estradiol-Fe Biphas (LO LOESTRIN FE) 1 MG-10 MCG / 10 MCG tablet Take 1 tablet by mouth daily. 04/23/13  Yes Crawford Givens, MD  ibuprofen (ADVIL,MOTRIN) 600 MG tablet Take 1 tablet (600 mg total) by mouth every 6 (six) hours as needed for pain. Patient not taking: Reported on 11/26/2014 04/23/13   Crawford Givens, MD  meclizine (ANTIVERT) 25 MG tablet Take 1 tablet (25 mg total) by mouth 3 (three) times daily as needed for dizziness. Patient not taking: Reported on 11/27/2014 11/26/14   Jasper Riling. Pickering, MD   BP 119/62 mmHg  Pulse 70  Temp(Src) 97.6 F (36.4 C) (Oral)  Resp 16  SpO2 99%  LMP 11/04/2014 Physical Exam  Constitutional: She appears well-developed and well-nourished. No distress.  HENT:  Head: Normocephalic and atraumatic.  Neck: Neck supple.  Cardiovascular: Normal rate and regular rhythm.   Pulmonary/Chest: Effort normal and breath sounds  normal. No respiratory distress. She has no wheezes. She has no rales.  Abdominal: Soft. She exhibits no distension. There is no tenderness. There is no rebound and no guarding.  Neurological: She is alert.  CN II-XII intact, EOMs intact, no pronator drift, grip strengths equal bilaterally; strength 5/5 in all extremities, sensation intact in all extremities.  finger to nose, heel to shin, rapid alternating movements normal.  Gait testing deferred     Skin: She is not diaphoretic.  Nursing note and vitals reviewed.   ED Course  Procedures (including critical care time) Labs Review Labs Reviewed   I-STAT CHEM 8, ED - Abnormal; Notable for the following:    Hemoglobin 15.3 (*)    All other components within normal limits    Imaging Review No results found.   EKG Interpretation None       2:03 PM Patient reports feeling much better after ativan and IVF.  Headache has resolved, dizziness much better.  Pt able to move head around without symptoms.  Neuro exam without deficits.  Gait is normal.    MDM   Final diagnoses:  Vertigo  Acute nonintractable headache, unspecified headache type    Afebrile, nontoxic patient with dizziness and headache.  Was seen in ED yesterday for same.  Her symptoms are not worse but she had symptoms return so she returned - she did not take the prescribed medication at home.  She had complete relief with one dose of ativan.  Neurologic exam is normal.  No red flags for headache.  Vertigo completely reproducible with head movement, suspect peripheral vertigo source.    D/C home with ativan, advised to take prescribed meclizine first as prescribed.  ENT follow up also given.  Discussed result, findings, treatment, and follow up  with patient.  Pt given return precautions.  Pt verbalizes understanding and agrees with plan.          Clayton Bibles, PA-C 11/27/14 [place], MD 11/28/14 [phone]

## 2014-11-27 NOTE — ED Notes (Signed)
Pt complains of dizziness, n/v and HA. Seen yesterday for same. Denies LOC today, but did have a syncopal episode yesterday. Pt alert and oriented.

## 2015-10-24 NOTE — L&D Delivery Note (Signed)
Delivery Note At  a viable female was delivered via  (Presentation:vtx ; LOA ).  APGAR:8 , 9; weight  pending. Placenta status:complete ,3VC .  Cord:  with the following complications: mod mec fluid but infant had vigorous cry at birth.    Anesthesia:  Epideral Episiotomy:  None Lacerations:  None  Est. Blood Loss (mL):  100  Mom to postpartum.  Baby to Couplet care / Skin to Skin.  Pleas Koch Prothero 08/22/2016, 5:34 AM

## 2015-12-29 LAB — OB RESULTS CONSOLE GC/CHLAMYDIA
CHLAMYDIA, DNA PROBE: NEGATIVE
Gonorrhea: NEGATIVE

## 2015-12-29 LAB — OB RESULTS CONSOLE RUBELLA ANTIBODY, IGM: RUBELLA: IMMUNE

## 2015-12-29 LAB — OB RESULTS CONSOLE HEPATITIS B SURFACE ANTIGEN: HEP B S AG: NEGATIVE

## 2015-12-29 LAB — OB RESULTS CONSOLE RPR: RPR: NONREACTIVE

## 2015-12-29 LAB — OB RESULTS CONSOLE ABO/RH: RH TYPE: POSITIVE

## 2015-12-29 LAB — OB RESULTS CONSOLE HIV ANTIBODY (ROUTINE TESTING): HIV: NONREACTIVE

## 2015-12-29 LAB — OB RESULTS CONSOLE ANTIBODY SCREEN: Antibody Screen: NEGATIVE

## 2016-03-06 ENCOUNTER — Encounter (HOSPITAL_COMMUNITY): Payer: Self-pay | Admitting: *Deleted

## 2016-03-06 ENCOUNTER — Inpatient Hospital Stay (HOSPITAL_COMMUNITY)
Admission: AD | Admit: 2016-03-06 | Discharge: 2016-03-06 | Disposition: A | Discharge status: Home / Self Care | Payer: BLUE CROSS/BLUE SHIELD | Source: Ambulatory Visit | Attending: Obstetrics and Gynecology | Admitting: Obstetrics and Gynecology

## 2016-03-06 DIAGNOSIS — Z3A16 16 weeks gestation of pregnancy: Secondary | ICD-10-CM | POA: Diagnosis not present

## 2016-03-06 DIAGNOSIS — O210 Mild hyperemesis gravidarum: Secondary | ICD-10-CM | POA: Insufficient documentation

## 2016-03-06 HISTORY — DX: Essential (primary) hypertension: I10

## 2016-03-06 HISTORY — DX: Unspecified infectious disease: B99.9

## 2016-03-06 MED ORDER — DEXTROSE 5 % IN LACTATED RINGERS IV BOLUS
250.0000 mL | Freq: Once | INTRAVENOUS | Status: AC
  Administered 2016-03-06: 14:00:00 via INTRAVENOUS

## 2016-03-06 MED ORDER — ONDANSETRON HCL 40 MG/20ML IJ SOLN
8.0000 mg | Freq: Once | INTRAMUSCULAR | Status: AC
  Administered 2016-03-06: 8 mg via INTRAVENOUS
  Filled 2016-03-06: qty 4

## 2016-03-06 NOTE — MAU Note (Signed)
Did retain food she ate today, just felt nauseous.

## 2016-03-06 NOTE — MAU Note (Signed)
Urine in lab 

## 2016-03-06 NOTE — MAU Note (Signed)
Was at dr's office, was told " large ketones, needed 2 bags of IVs".  Has lost about 20#.

## 2016-03-06 NOTE — Discharge Instructions (Signed)
Eating Plan for Hyperemesis Gravidarum Severe cases of hyperemesis gravidarum can lead to dehydration and malnutrition. The hyperemesis eating plan is one way to lessen the symptoms of nausea and vomiting. It is often used with prescribed medicines to control your symptoms.  WHAT CAN I DO TO RELIEVE MY SYMPTOMS? Listen to your body. Everyone is different and has different preferences. Find what works best for you. Some of the following things may help:  Eat and drink slowly.  Eat 5-6 small meals daily instead of 3 large meals.   Eat crackers before you get out of bed in the morning.   Starchy foods are usually well tolerated (such as cereal, toast, bread, potatoes, pasta, rice, and pretzels).   Ginger may help with nausea. Add  tsp ground ginger to hot tea or choose ginger tea.   Try drinking 100% fruit juice or an electrolyte drink.  Continue to take your prenatal vitamins as directed by your health care provider. If you are having trouble taking your prenatal vitamins, talk with your health care provider about different options.  Include at least 1 serving of protein with your meals and snacks (such as meats or poultry, beans, nuts, eggs, or yogurt). Try eating a protein-rich snack before bed (such as cheese and crackers or a half turkey or peanut butter sandwich). WHAT THINGS SHOULD I AVOID TO REDUCE MY SYMPTOMS? The following things may help reduce your symptoms:  Avoid foods with strong smells. Try eating meals in well-ventilated areas that are free of odors.  Avoid drinking water or other beverages with meals. Try not to drink anything less than 30 minutes before and after meals.  Avoid drinking more than 1 cup of fluid at a time.  Avoid fried or high-fat foods, such as butter and cream sauces.  Avoid spicy foods.  Avoid skipping meals the best you can. Nausea can be more intense on an empty stomach. If you cannot tolerate food at that time, do not force it. Try sucking on  ice chips or other frozen items and make up the calories later.  Avoid lying down within 2 hours after eating.   This information is not intended to replace advice given to you by your health care provider. Make sure you discuss any questions you have with your health care provider.   Document Released: 08/06/2007 Document Revised: 10/14/2013 Document Reviewed: 08/13/2013 Elsevier Interactive Patient Education 2016 [employer] Hyperemesis Gravidarum Hyperemesis gravidarum is a severe form of nausea and vomiting that happens during pregnancy. Hyperemesis is worse than morning sickness. It may cause you to have nausea or vomiting all day for many days. It may keep you from eating and drinking enough food and liquids. Hyperemesis usually occurs during the first half (the first 20 weeks) of pregnancy. It often goes away once a woman is in her second half of pregnancy. However, sometimes hyperemesis continues through an entire pregnancy.  CAUSES  The cause of this condition is not completely known but is thought to be related to changes in the body's hormones when pregnant. It could be from the high level of the pregnancy hormone or an increase in estrogen in the body.  SIGNS AND SYMPTOMS   Severe nausea and vomiting.  Nausea that does not go away.  Vomiting that does not allow you to keep any food down.  Weight loss and body fluid loss (dehydration).  Having no desire to eat or not liking food you have previously enjoyed. DIAGNOSIS  Your health care provider will   do a physical exam and ask you about your symptoms. He or she may also order blood tests and urine tests to make sure something else is not causing the problem.  TREATMENT  You may only need medicine to control the problem. If medicines do not control the nausea and vomiting, you will be treated in the hospital to prevent dehydration, increased acid in the blood (acidosis), weight loss, and changes in the electrolytes in your body  that may harm the unborn baby (fetus). You may need IV fluids.  HOME CARE INSTRUCTIONS   Only take over-the-counter or prescription medicines as directed by your health care provider.  Try eating a couple of dry crackers or toast in the morning before getting out of bed.  Avoid foods and smells that upset your stomach.  Avoid fatty and spicy foods.  Eat 5-6 small meals a day.  Do not drink when eating meals. Drink between meals.  For snacks, eat high-protein foods, such as cheese.  Eat or suck on things that have ginger in them. Ginger helps nausea.  Avoid food preparation. The smell of food can spoil your appetite.  Avoid iron pills and iron in your multivitamins until after 3-4 months of being pregnant. However, consult with your health care provider before stopping any prescribed iron pills. SEEK MEDICAL CARE IF:   Your abdominal pain increases.  You have a severe headache.  You have vision problems.  You are losing weight. SEEK IMMEDIATE MEDICAL CARE IF:   You are unable to keep fluids down.  You vomit blood.  You have constant nausea and vomiting.  You have excessive weakness.  You have extreme thirst.  You have dizziness or fainting.  You have a fever or persistent symptoms for more than 2-3 days.  You have a fever and your symptoms suddenly get worse. MAKE SURE YOU:   Understand these instructions.  Will watch your condition.  Will get help right away if you are not doing well or get worse.   This information is not intended to replace advice given to you by your health care provider. Make sure you discuss any questions you have with your health care provider.   Document Released: 10/09/2005 Document Revised: 07/30/2013 Document Reviewed: 05/21/2013 Elsevier Interactive Patient Education 2016 [employer] Inc[employer]  

## 2016-07-19 LAB — OB RESULTS CONSOLE GBS: STREP GROUP B AG: POSITIVE

## 2016-07-30 ENCOUNTER — Encounter (HOSPITAL_COMMUNITY): Payer: Self-pay | Admitting: *Deleted

## 2016-07-30 ENCOUNTER — Inpatient Hospital Stay (HOSPITAL_COMMUNITY)
Admission: AD | Admit: 2016-07-30 | Discharge: 2016-07-30 | Disposition: A | Discharge status: Home / Self Care | Payer: BLUE CROSS/BLUE SHIELD | Source: Ambulatory Visit | Attending: Obstetrics and Gynecology | Admitting: Obstetrics and Gynecology

## 2016-07-30 DIAGNOSIS — Z3A37 37 weeks gestation of pregnancy: Secondary | ICD-10-CM | POA: Diagnosis not present

## 2016-07-30 DIAGNOSIS — O26893 Other specified pregnancy related conditions, third trimester: Secondary | ICD-10-CM | POA: Diagnosis present

## 2016-07-30 DIAGNOSIS — R102 Pelvic and perineal pain: Secondary | ICD-10-CM | POA: Insufficient documentation

## 2016-07-30 LAB — CBC
HCT: 28.1 % — ABNORMAL LOW (ref 36.0–46.0)
HEMOGLOBIN: 9.2 g/dL — AB (ref 12.0–15.0)
MCH: 25.3 pg — AB (ref 26.0–34.0)
MCHC: 32.7 g/dL (ref 30.0–36.0)
MCV: 77.4 fL — ABNORMAL LOW (ref 78.0–100.0)
Platelets: 217 10*3/uL (ref 150–400)
RBC: 3.63 MIL/uL — ABNORMAL LOW (ref 3.87–5.11)
RDW: 13.9 % (ref 11.5–15.5)
WBC: 8.5 10*3/uL (ref 4.0–10.5)

## 2016-07-30 LAB — COMPREHENSIVE METABOLIC PANEL
ALBUMIN: 2.6 g/dL — AB (ref 3.5–5.0)
ALK PHOS: 93 U/L (ref 38–126)
ALT: 23 U/L (ref 14–54)
AST: 32 U/L (ref 15–41)
Anion gap: 6 (ref 5–15)
BILIRUBIN TOTAL: 0.3 mg/dL (ref 0.3–1.2)
BUN: 10 mg/dL (ref 6–20)
CALCIUM: 8.7 mg/dL — AB (ref 8.9–10.3)
CHLORIDE: 107 mmol/L (ref 101–111)
CO2: 22 mmol/L (ref 22–32)
CREATININE: 0.66 mg/dL (ref 0.44–1.00)
Glucose, Bld: 99 mg/dL (ref 65–99)
Potassium: 3.4 mmol/L — ABNORMAL LOW (ref 3.5–5.1)
SODIUM: 135 mmol/L (ref 135–145)
TOTAL PROTEIN: 5.9 g/dL — AB (ref 6.5–8.1)

## 2016-07-30 LAB — PROTEIN / CREATININE RATIO, URINE
CREATININE, URINE: 231 mg/dL
PROTEIN CREATININE RATIO: 0.07 mg/mg{creat} (ref 0.00–0.15)
TOTAL PROTEIN, URINE: 17 mg/dL

## 2016-07-30 LAB — URIC ACID: URIC ACID, SERUM: 4.2 mg/dL (ref 2.3–6.6)

## 2016-07-30 LAB — LACTATE DEHYDROGENASE: LDH: 204 U/L — AB (ref 98–192)

## 2016-07-30 NOTE — MAU Note (Signed)
Pt presents complaining of increased pelvic pressure. Denies leaking or bleeding. Reports good fetal movement. Unsure if having contractions. 3cm in office last week.

## 2016-08-15 ENCOUNTER — Inpatient Hospital Stay (HOSPITAL_COMMUNITY)
Admission: AD | Admit: 2016-08-15 | Discharge: 2016-08-15 | Disposition: A | Discharge status: Home / Self Care | Payer: BLUE CROSS/BLUE SHIELD | Source: Ambulatory Visit | Attending: Obstetrics and Gynecology | Admitting: Obstetrics and Gynecology

## 2016-08-15 ENCOUNTER — Encounter (HOSPITAL_COMMUNITY): Payer: Self-pay | Admitting: *Deleted

## 2016-08-15 DIAGNOSIS — Z3A00 Weeks of gestation of pregnancy not specified: Secondary | ICD-10-CM | POA: Diagnosis not present

## 2016-08-15 NOTE — Discharge Instructions (Signed)
Keep your scheduled appt for prenatal care. Call the office or provider on call with further concerns or return to MAU as needed. Drink 8-10 glasses of water per day.

## 2016-08-17 ENCOUNTER — Encounter (HOSPITAL_COMMUNITY): Payer: Self-pay | Admitting: *Deleted

## 2016-08-17 ENCOUNTER — Telehealth (HOSPITAL_COMMUNITY): Payer: Self-pay | Admitting: *Deleted

## 2016-08-17 NOTE — Telephone Encounter (Signed)
Preadmission screen  

## 2016-08-18 ENCOUNTER — Inpatient Hospital Stay (HOSPITAL_COMMUNITY)
Admission: AD | Admit: 2016-08-18 | Discharge: 2016-08-19 | Disposition: A | Discharge status: Home / Self Care | Payer: BLUE CROSS/BLUE SHIELD | Source: Ambulatory Visit | Attending: Obstetrics and Gynecology | Admitting: Obstetrics and Gynecology

## 2016-08-18 ENCOUNTER — Encounter (HOSPITAL_COMMUNITY): Payer: Self-pay | Admitting: *Deleted

## 2016-08-18 DIAGNOSIS — Z3A40 40 weeks gestation of pregnancy: Secondary | ICD-10-CM | POA: Insufficient documentation

## 2016-08-18 DIAGNOSIS — O471 False labor at or after 37 completed weeks of gestation: Secondary | ICD-10-CM | POA: Insufficient documentation

## 2016-08-18 NOTE — MAU Provider Note (Signed)
History    Beth Walker is a 34y.o. [id number] at 40.3wks who presents, after phone call, for contractions.  Patient states contractions started at 2100 and have been increasing in frequency and intensity.  Patient currently rates contractions at 9/10, but declines treatment.  Patient does desire epidural and is GBS positive.   Patient Active Problem List   Diagnosis Date Noted  . Hx of Fibroids in past 04/22/2013  . GBS (group B streptococcus) UTI complicating pregnancy [id number]    No chief complaint on file.  HPI  OB History    Gravida Para Term Preterm AB Living   7 3 3   3 3    SAB TAB Ectopic Multiple Live Births   2 1     3       Past Medical History:  Diagnosis Date  . Fibroids    history  . Hypertension   . Infection    UTI  . SVD (spontaneous vaginal delivery)    x 3  . Termination of pregnancy 2002   x 1  . UTI (lower urinary tract infection)    just finished abx for uti - 7 day treatment    Past Surgical History:  Procedure Laterality Date  . DILATION AND CURETTAGE OF UTERUS    . DILATION AND EVACUATION N/A 04/23/2013   Procedure: DILATATION AND EVACUATION;  Surgeon: Betsy Coder, MD;  Location: [facility];  Service: Gynecology;  Laterality: N/A;  . WISDOM TOOTH EXTRACTION      Family History  Problem Relation Age of Onset  . Hypertension Mother   . Hypertension Father   . Cancer Maternal Grandmother   . Stroke Paternal Grandmother   . Diabetes Paternal Grandfather     Social History  Substance Use Topics  . Smoking status: Never Smoker  . Smokeless tobacco: Never Used  . Alcohol use Yes     Comment: socially but none with pregnancy    Allergies:  Allergies  Allergen Reactions  . Pineapple     Prescriptions Prior to Admission  Medication Sig Dispense Refill Last Dose  . terconazole (TERAZOL 7) 0.4 % vaginal cream Place 1 applicator vaginally at bedtime.   Past Month at Unknown time    ROS  See HPI Above Physical Exam   Height 5\' 6"   (1.676 m), weight 89.4 kg (197 lb).  No results found for this or any previous visit (from the past 24 hour(s)).  Physical Exam SVE: 3-4/50/-3 Vertex, Posterior  FHR: 125 bpm, Mod Var, -Decels, +Accels UC: Palpates mild to moderate ED Course  Assessment: IUP at 40.3wks Cat I FT Contractions  Plan: -Given Early Labor Options:  A: Home with or w/o therapeutic rest B: Ambulate and reassess C: Pain medication and reassess  -Patient opts for B -Can ambulate after reactive NST -Will reassess in 2 hours  Follow Up (0149) -Nurse call reports VE remains same -Patient declines pain mgmt and continues to report contractions 9/10 -Discharge to home  -Follow up in office as scheduled  [place], MSN 08/18/2016 11:41 PM

## 2016-08-18 NOTE — MAU Note (Signed)
Pt c/o contractions every 7 min starting at 1930 this evening . Denies any bleeding or leaking of fluid; reports light green mucous and states baby has been active.

## 2016-08-19 DIAGNOSIS — O471 False labor at or after 37 completed weeks of gestation: Secondary | ICD-10-CM | POA: Diagnosis present

## 2016-08-19 DIAGNOSIS — Z3A40 40 weeks gestation of pregnancy: Secondary | ICD-10-CM | POA: Diagnosis not present

## 2016-08-21 ENCOUNTER — Encounter (HOSPITAL_COMMUNITY): Payer: Self-pay | Admitting: *Deleted

## 2016-08-21 ENCOUNTER — Inpatient Hospital Stay (HOSPITAL_COMMUNITY)
Admission: AD | Admit: 2016-08-21 | Discharge: 2016-08-23 | DRG: 775 | Disposition: A | Discharge status: Home / Self Care | Payer: BLUE CROSS/BLUE SHIELD | Source: Ambulatory Visit | Attending: Obstetrics and Gynecology | Admitting: Obstetrics and Gynecology

## 2016-08-21 DIAGNOSIS — Z3A40 40 weeks gestation of pregnancy: Secondary | ICD-10-CM

## 2016-08-21 DIAGNOSIS — Z8249 Family history of ischemic heart disease and other diseases of the circulatory system: Secondary | ICD-10-CM

## 2016-08-21 DIAGNOSIS — Z823 Family history of stroke: Secondary | ICD-10-CM | POA: Diagnosis not present

## 2016-08-21 DIAGNOSIS — O480 Post-term pregnancy: Secondary | ICD-10-CM | POA: Diagnosis present

## 2016-08-21 DIAGNOSIS — B951 Streptococcus, group B, as the cause of diseases classified elsewhere: Secondary | ICD-10-CM

## 2016-08-21 DIAGNOSIS — O99824 Streptococcus B carrier state complicating childbirth: Secondary | ICD-10-CM | POA: Diagnosis present

## 2016-08-21 DIAGNOSIS — O09529 Supervision of elderly multigravida, unspecified trimester: Secondary | ICD-10-CM

## 2016-08-21 DIAGNOSIS — Z91018 Allergy to other foods: Secondary | ICD-10-CM

## 2016-08-21 DIAGNOSIS — D649 Anemia, unspecified: Secondary | ICD-10-CM | POA: Diagnosis present

## 2016-08-21 DIAGNOSIS — O9902 Anemia complicating childbirth: Secondary | ICD-10-CM | POA: Diagnosis present

## 2016-08-21 DIAGNOSIS — Z833 Family history of diabetes mellitus: Secondary | ICD-10-CM | POA: Diagnosis not present

## 2016-08-21 LAB — ABO/RH: ABO/RH(D): B POS

## 2016-08-21 LAB — CBC
HCT: 29.8 % — ABNORMAL LOW (ref 36.0–46.0)
HEMOGLOBIN: 9.9 g/dL — AB (ref 12.0–15.0)
MCH: 25.3 pg — ABNORMAL LOW (ref 26.0–34.0)
MCHC: 33.2 g/dL (ref 30.0–36.0)
MCV: 76 fL — ABNORMAL LOW (ref 78.0–100.0)
PLATELETS: 248 10*3/uL (ref 150–400)
RBC: 3.92 MIL/uL (ref 3.87–5.11)
RDW: 14.6 % (ref 11.5–15.5)
WBC: 8.3 10*3/uL (ref 4.0–10.5)

## 2016-08-21 LAB — TYPE AND SCREEN
ABO/RH(D): B POS
Antibody Screen: NEGATIVE

## 2016-08-21 MED ORDER — ACETAMINOPHEN 325 MG PO TABS: 650.0000 mg | ORAL_TABLET | ORAL | Status: DC | PRN

## 2016-08-21 MED ORDER — LACTATED RINGERS IV SOLN: 500.0000 mL | INTRAVENOUS | Status: DC | PRN

## 2016-08-21 MED ORDER — ONDANSETRON HCL 4 MG/2ML IJ SOLN
4.0000 mg | Freq: Four times a day (QID) | INTRAMUSCULAR | Status: DC | PRN
  Administered 2016-08-21 – 2016-08-22 (×2): 4 mg via INTRAVENOUS
  Filled 2016-08-21 (×2): qty 2

## 2016-08-21 MED ORDER — LIDOCAINE HCL (PF) 1 % IJ SOLN
30.0000 mL | INTRAMUSCULAR | Status: DC | PRN
  Filled 2016-08-21: qty 30

## 2016-08-21 MED ORDER — TERBUTALINE SULFATE 1 MG/ML IJ SOLN
0.2500 mg | Freq: Once | INTRAMUSCULAR | Status: DC | PRN
  Filled 2016-08-21: qty 1

## 2016-08-21 MED ORDER — OXYTOCIN 40 UNITS IN LACTATED RINGERS INFUSION - SIMPLE MED
2.5000 [IU]/h | INTRAVENOUS | Status: DC
  Filled 2016-08-21: qty 1000

## 2016-08-21 MED ORDER — FLEET ENEMA 7-19 GM/118ML RE ENEM: 1.0000 | ENEMA | RECTAL | Status: DC | PRN

## 2016-08-21 MED ORDER — OXYTOCIN BOLUS FROM INFUSION
500.0000 mL | Freq: Once | INTRAVENOUS | Status: AC
  Administered 2016-08-22: 500 mL via INTRAVENOUS

## 2016-08-21 MED ORDER — PENICILLIN G POTASSIUM 5000000 UNITS IJ SOLR
2.5000 10*6.[IU] | INTRAVENOUS | Status: DC
  Administered 2016-08-21 – 2016-08-22 (×2): 2.5 10*6.[IU] via INTRAVENOUS
  Filled 2016-08-21 (×5): qty 2.5

## 2016-08-21 MED ORDER — FENTANYL CITRATE (PF) 100 MCG/2ML IJ SOLN
50.0000 ug | INTRAMUSCULAR | Status: DC | PRN
  Administered 2016-08-21: 100 ug via INTRAVENOUS
  Filled 2016-08-21: qty 2

## 2016-08-21 MED ORDER — OXYTOCIN 40 UNITS IN LACTATED RINGERS INFUSION - SIMPLE MED
1.0000 m[IU]/min | INTRAVENOUS | Status: DC
Start: 2016-08-21 — End: 2016-08-22
  Administered 2016-08-21: 2 m[IU]/min via INTRAVENOUS

## 2016-08-21 MED ORDER — OXYCODONE-ACETAMINOPHEN 5-325 MG PO TABS
1.0000 | ORAL_TABLET | ORAL | Status: DC | PRN
Start: 2016-08-21 — End: 2016-08-22

## 2016-08-21 MED ORDER — OXYCODONE-ACETAMINOPHEN 5-325 MG PO TABS: 2.0000 | ORAL_TABLET | ORAL | Status: DC | PRN

## 2016-08-21 MED ORDER — LACTATED RINGERS IV SOLN
INTRAVENOUS | Status: DC
  Administered 2016-08-21: 17:00:00 via INTRAVENOUS

## 2016-08-21 MED ORDER — DEXTROSE 5 % IV SOLN
5.0000 10*6.[IU] | Freq: Once | INTRAVENOUS | Status: AC
  Administered 2016-08-21: 5 10*6.[IU] via INTRAVENOUS
  Filled 2016-08-21: qty 5

## 2016-08-21 MED ORDER — SOD CITRATE-CITRIC ACID 500-334 MG/5ML PO SOLN: 30.0000 mL | ORAL | Status: DC | PRN

## 2016-08-21 NOTE — MAU Note (Signed)
Sent from office for prolonged monitoring.  Pt is overdue, BPP was 6/8.

## 2016-08-21 NOTE — Progress Notes (Signed)
Subjective: Starting to feel contractions.  2nd bag of PCN running.   Objective: BP 131/82   Pulse 87   Temp 97.6 F (36.4 C)   Resp 18   Ht 5' 6.5" (1.689 m)   Wt 88.9 kg (196 lb)   BMI 31.16 kg/m  No intake/output data recorded. No intake/output data recorded.  FHT: Category 1 UC:   regular, every 3 minutes SVE:   3/90/-2 AROM mod mec fluid Pitocin at 2 mu   Assessment:  Pt is a [id number] at 40.6 IUP in active labor Mod mec fluid Cat 1 strip  Plan: Epidural when needed Anticipate NSVD  Starla Link CNM, MSN 08/21/2016, 10:40 PM

## 2016-08-21 NOTE — H&P (Signed)
Beth Walker is a 35 y.o. female presenting for NST. BPP at the office was 6/8.  -2 for sustained breating.  She now c/o pain and pelvic pressure OB History    Gravida Para Term Preterm AB Living   7 3 3   3 3    SAB TAB Ectopic Multiple Live Births   2 1     3      Past Medical History:  Diagnosis Date  . Fibroids    history  . Hypertension   . Infection    UTI  . SVD (spontaneous vaginal delivery)    x 3  . Termination of pregnancy 2002   x 1  . UTI (lower urinary tract infection)    just finished abx for uti - 7 day treatment   Past Surgical History:  Procedure Laterality Date  . DILATION AND CURETTAGE OF UTERUS    . DILATION AND EVACUATION N/A 04/23/2013   Procedure: DILATATION AND EVACUATION;  Surgeon: Beth Coder, MD;  Location: [facility];  Service: Gynecology;  Laterality: N/A;  . WISDOM TOOTH EXTRACTION     Family History: family history includes Cancer in her maternal grandmother; Diabetes in her paternal grandfather; Hypertension in her father and mother; Stroke in her paternal grandmother. Social History:  reports that she has never smoked. She has never used smokeless tobacco. She reports that she drinks alcohol. She reports that she does not use drugs.     Maternal Diabetes: No Genetic Screening: Normal Maternal Ultrasounds/Referrals: Normal Fetal Ultrasounds or other Referrals:  None Maternal Substance Abuse:  No Significant Maternal Medications:  None Significant Maternal Lab Results:  None Other Comments:  None  ROS History Dilation: 3 Effacement (%): 60 Station: -3 Exam by:: Beth Ochs RN Blood pressure 134/61, pulse 90, temperature 98.1 F (36.7 C), temperature source Oral, resp. rate 20. Exam Physical Exam Physical Examination: General appearance - alert, well appearing, and in no distress Chest - clear to auscultation, no wheezes, rales or rhonchi, symmetric air entry Heart - normal rate and regular rhythm Abdomen - soft, nontender,  nondistended, no masses or organomegaly gravid Extremities - peripheral pulses normal, no pedal edema, no clubbing or cyanosis, Homan's sign negative bilaterally Prenatal labs: ABO, Rh: B/Positive/-- (03/08 0000) Antibody: Negative (03/08 0000) Rubella: Immune (03/08 0000) RPR: Nonreactive (03/08 0000)  HBsAg: Negative (03/08 0000)  HIV: Non-reactive (03/08 0000)  GBS: Positive (09/27 0000)   Assessment/Plan: Past due with advanced cervical dilation BPP 6/8 NST reassuring PCN for GBS Anticpate SVD   Beth Walker A 08/21/2016, 5:02 PM

## 2016-08-21 NOTE — Progress Notes (Signed)
Dr. Charlesetta Garibaldi requests NST & cervical check,  Informed MD fetus is very active.

## 2016-08-21 NOTE — Progress Notes (Signed)
Dr. Charlesetta Garibaldi informed of SVE 3 cm's, difficult to discern FHR baseline due to active FM, states she is coming to see pt.

## 2016-08-22 ENCOUNTER — Encounter (HOSPITAL_COMMUNITY): Payer: Self-pay

## 2016-08-22 ENCOUNTER — Inpatient Hospital Stay (HOSPITAL_COMMUNITY): Payer: BLUE CROSS/BLUE SHIELD | Admitting: Anesthesiology

## 2016-08-22 LAB — RPR: RPR Ser Ql: NONREACTIVE

## 2016-08-22 MED ORDER — ZOLPIDEM TARTRATE 5 MG PO TABS: 5.0000 mg | ORAL_TABLET | Freq: Every evening | ORAL | Status: DC | PRN

## 2016-08-22 MED ORDER — SENNOSIDES-DOCUSATE SODIUM 8.6-50 MG PO TABS
2.0000 | ORAL_TABLET | ORAL | Status: DC
  Administered 2016-08-23: 2 via ORAL
  Filled 2016-08-22: qty 2

## 2016-08-22 MED ORDER — LACTATED RINGERS IV SOLN: 500.0000 mL | Freq: Once | INTRAVENOUS | Status: DC

## 2016-08-22 MED ORDER — IBUPROFEN 600 MG PO TABS
600.0000 mg | ORAL_TABLET | Freq: Four times a day (QID) | ORAL | Status: DC
  Administered 2016-08-22 – 2016-08-23 (×4): 600 mg via ORAL
  Filled 2016-08-22 (×4): qty 1

## 2016-08-22 MED ORDER — PHENYLEPHRINE 40 MCG/ML (10ML) SYRINGE FOR IV PUSH (FOR BLOOD PRESSURE SUPPORT)
80.0000 ug | PREFILLED_SYRINGE | INTRAVENOUS | Status: DC | PRN
  Filled 2016-08-22: qty 5

## 2016-08-22 MED ORDER — DIPHENHYDRAMINE HCL 50 MG/ML IJ SOLN: 12.5000 mg | INTRAMUSCULAR | Status: DC | PRN

## 2016-08-22 MED ORDER — EPHEDRINE 5 MG/ML INJ
10.0000 mg | INTRAVENOUS | Status: DC | PRN
  Filled 2016-08-22: qty 4

## 2016-08-22 MED ORDER — ONDANSETRON HCL 4 MG PO TABS: 4.0000 mg | ORAL_TABLET | ORAL | Status: DC | PRN

## 2016-08-22 MED ORDER — FENTANYL 2.5 MCG/ML BUPIVACAINE 1/10 % EPIDURAL INFUSION (WH - ANES)
INTRAMUSCULAR | Status: AC
  Filled 2016-08-22: qty 125

## 2016-08-22 MED ORDER — BENZOCAINE-MENTHOL 20-0.5 % EX AERO: 1.0000 "application " | INHALATION_SPRAY | CUTANEOUS | Status: DC | PRN

## 2016-08-22 MED ORDER — LIDOCAINE HCL (PF) 1 % IJ SOLN
INTRAMUSCULAR | Status: DC | PRN
  Administered 2016-08-22: 6 mL via EPIDURAL
  Administered 2016-08-22: 4 mL via EPIDURAL

## 2016-08-22 MED ORDER — WITCH HAZEL-GLYCERIN EX PADS: 1.0000 "application " | MEDICATED_PAD | CUTANEOUS | Status: DC | PRN

## 2016-08-22 MED ORDER — FENTANYL 2.5 MCG/ML BUPIVACAINE 1/10 % EPIDURAL INFUSION (WH - ANES)
14.0000 mL/h | INTRAMUSCULAR | Status: DC | PRN
  Administered 2016-08-22: 14 mL/h via EPIDURAL

## 2016-08-22 MED ORDER — COCONUT OIL OIL
1.0000 "application " | TOPICAL_OIL | Status: DC | PRN
  Administered 2016-08-22: 1 via TOPICAL
  Filled 2016-08-22: qty 120

## 2016-08-22 MED ORDER — PHENYLEPHRINE 40 MCG/ML (10ML) SYRINGE FOR IV PUSH (FOR BLOOD PRESSURE SUPPORT)
PREFILLED_SYRINGE | INTRAVENOUS | Status: AC
  Filled 2016-08-22: qty 20

## 2016-08-22 MED ORDER — ONDANSETRON HCL 4 MG/2ML IJ SOLN: 4.0000 mg | INTRAMUSCULAR | Status: DC | PRN

## 2016-08-22 MED ORDER — PRENATAL MULTIVITAMIN CH
1.0000 | ORAL_TABLET | Freq: Every day | ORAL | Status: DC
  Administered 2016-08-22: 1 via ORAL
  Filled 2016-08-22: qty 1

## 2016-08-22 MED ORDER — ACETAMINOPHEN 325 MG PO TABS
650.0000 mg | ORAL_TABLET | ORAL | Status: DC | PRN
  Administered 2016-08-22 (×2): 650 mg via ORAL
  Filled 2016-08-22 (×2): qty 2

## 2016-08-22 MED ORDER — SIMETHICONE 80 MG PO CHEW: 80.0000 mg | CHEWABLE_TABLET | ORAL | Status: DC | PRN

## 2016-08-22 MED ORDER — TETANUS-DIPHTH-ACELL PERTUSSIS 5-2.5-18.5 LF-MCG/0.5 IM SUSP: 0.5000 mL | Freq: Once | INTRAMUSCULAR | Status: DC

## 2016-08-22 MED ORDER — DIPHENHYDRAMINE HCL 25 MG PO CAPS: 25.0000 mg | ORAL_CAPSULE | Freq: Four times a day (QID) | ORAL | Status: DC | PRN

## 2016-08-22 NOTE — Progress Notes (Signed)
Subjective: Comfortable after the epideral  Objective: BP (!) 136/97   Pulse 82   Temp 98.2 F (36.8 C) (Oral)   Resp 16   Ht 5' 6.5" (1.689 m)   Wt 88.9 kg (196 lb)   BMI 31.16 kg/m  No intake/output data recorded. No intake/output data recorded.  FHT: Category 1 UC:   regular, every 3 minutes SVE:   4/90/-1 Pitocin at 8 mu IUPC placed without difficulty   Assessment:  Pt is a [id number] in early active labor Cat 1 strip  Plan: Monitor progress Anticipate SVD  Starla Link CNM, MSN 08/22/2016, 1:28 AM

## 2016-08-22 NOTE — Anesthesia Procedure Notes (Signed)
Epidural Patient location during procedure: OB Start time: 08/22/2016 12:52 AM End time: 08/22/2016 12:57 AM  Staffing Anesthesiologist: Nilda Simmer Performed: anesthesiologist   Preanesthetic Checklist Completed: patient identified, surgical consent, pre-op evaluation, timeout performed, IV checked, risks and benefits discussed and monitors and equipment checked  Epidural Patient position: sitting Prep: site prepped and draped and DuraPrep Patient monitoring: continuous pulse ox and blood pressure Approach: midline Location: L2-L3 Injection technique: LOR air  Needle:  Needle type: Tuohy  Needle gauge: 17 G Needle length: 9 cm and 9 Needle insertion depth: 6 cm Catheter type: closed end flexible Catheter size: 19 Gauge Catheter at skin depth: 11 cm Test dose: negative  Assessment Events: blood not aspirated, injection not painful, no injection resistance, negative IV test and no paresthesia  Additional Notes Epidural placed easily on first attempt.Reason for block:procedure for pain

## 2016-08-22 NOTE — Anesthesia Postprocedure Evaluation (Signed)
Anesthesia Post Note  Patient: Beth Walker  Procedure(s) Performed: * No procedures listed *  Patient location during evaluation: Mother Baby Anesthesia Type: Epidural Level of consciousness: awake, awake and alert, oriented and patient cooperative Pain management: pain level controlled Vital Signs Assessment: post-procedure vital signs reviewed and stable Respiratory status: spontaneous breathing, nonlabored ventilation and respiratory function stable Cardiovascular status: stable Postop Assessment: patient able to bend at knees, no headache, no backache and no signs of nausea or vomiting Anesthetic complications: no     Last Vitals:  Vitals:   08/22/16 0601 08/22/16 0635  BP: 130/60 130/79  Pulse: 77 67  Resp: 18 18  Temp:  36.9 C    Last Pain:  Vitals:   08/22/16 0635  TempSrc: Oral  PainSc: 0-No pain   Pain Goal: Patients Stated Pain Goal: 0 (08/21/16 1746)               Satvik Parco L

## 2016-08-22 NOTE — Anesthesia Preprocedure Evaluation (Signed)
Anesthesia Evaluation  Patient identified by MRN, date of birth, ID band Patient awake    Reviewed: Allergy & Precautions, NPO status , Patient's Chart, lab work & pertinent test results  History of Anesthesia Complications Negative for: history of anesthetic complications  Airway Mallampati: III  TM Distance: >3 FB Neck ROM: Full    Dental  (+) Teeth Intact   Pulmonary neg pulmonary ROS,    Pulmonary exam normal breath sounds clear to auscultation       Cardiovascular hypertension,  Rhythm:Regular Rate:Normal     Neuro/Psych negative neurological ROS  negative psych ROS   GI/Hepatic negative GI ROS, Neg liver ROS,   Endo/Other  negative endocrine ROS  Renal/GU negative Renal ROS     Musculoskeletal   Abdominal   Peds  Hematology negative hematology ROS (+)   Anesthesia Other Findings   Reproductive/Obstetrics (+) Pregnancy                             Anesthesia Physical Anesthesia Plan  ASA: II  Anesthesia Plan: Epidural   Post-op Pain Management:    Induction:   Airway Management Planned: Natural Airway  Additional Equipment:   Intra-op Plan:   Post-operative Plan:   Informed Consent: I have reviewed the patients History and Physical, chart, labs and discussed the procedure including the risks, benefits and alternatives for the proposed anesthesia with the patient or authorized representative who has indicated his/her understanding and acceptance.     Plan Discussed with:   Anesthesia Plan Comments: (I have discussed risks of neuraxial anesthesia including but not limited to infection, bleeding, nerve injury, back pain, headache, seizures, and failure of block. Patient denies bleeding disorders and is not currently anticoagulated. Labs have been reviewed. Risks and benefits discussed. All patient's questions answered.   Platelets 248)        Anesthesia Quick  Evaluation

## 2016-08-22 NOTE — Lactation Note (Signed)
This note was copied from a baby's chart. Lactation Consultation Note  Patient Name: Beth Walker [id number] Date: 08/22/2016 Reason for consult: Initial assessment Breastfeeding consultation services and support information given and reviewed with patient.  Mom is experienced breastfeeding previous babies.  Newborn is 60 hours old and has latched to breast without difficulty.  Mom states she knows how to hand express.  Instructed to watch baby for feeding cues and feed with any cue.  Instructed on waking techniques and breast massage during feedings.  Encouraged to call for assist/concerns prn.  Maternal Data Has patient been taught Hand Expression?: Yes Does the patient have breastfeeding experience prior to this delivery?: Yes  Feeding Feeding Type: Breast Fed Length of feed: 20 min  LATCH Score/Interventions Latch: Grasps breast easily, tongue down, lips flanged, rhythmical sucking.  Audible Swallowing: Spontaneous and intermittent Intervention(s): Skin to skin  Type of Nipple: Everted at rest and after stimulation  Comfort (Breast/Nipple): Soft / non-tender     Hold (Positioning): Assistance needed to correctly position infant at breast and maintain latch.  LATCH Score: 9  Lactation Tools Discussed/Used     Consult Status Consult Status: Follow-up Date: 08/23/16 Follow-up type: In-patient    Ave Filter 08/22/2016, 11:07 AM

## 2016-08-23 DIAGNOSIS — O09529 Supervision of elderly multigravida, unspecified trimester: Secondary | ICD-10-CM

## 2016-08-23 DIAGNOSIS — B951 Streptococcus, group B, as the cause of diseases classified elsewhere: Secondary | ICD-10-CM

## 2016-08-23 DIAGNOSIS — Z91018 Allergy to other foods: Secondary | ICD-10-CM

## 2016-08-23 LAB — CBC
HEMATOCRIT: 28.4 % — AB (ref 36.0–46.0)
Hemoglobin: 9.3 g/dL — ABNORMAL LOW (ref 12.0–15.0)
MCH: 25.3 pg — AB (ref 26.0–34.0)
MCHC: 32.7 g/dL (ref 30.0–36.0)
MCV: 77.2 fL — AB (ref 78.0–100.0)
Platelets: 237 10*3/uL (ref 150–400)
RBC: 3.68 MIL/uL — ABNORMAL LOW (ref 3.87–5.11)
RDW: 14.7 % (ref 11.5–15.5)
WBC: 9.1 10*3/uL (ref 4.0–10.5)

## 2016-08-23 MED ORDER — FERROUS SULFATE 325 (65 FE) MG PO TABS: 325.0000 mg | ORAL_TABLET | Freq: Two times a day (BID) | ORAL | 3 refills | Status: DC

## 2016-08-23 MED ORDER — IBUPROFEN 600 MG PO TABS: 600.0000 mg | ORAL_TABLET | Freq: Four times a day (QID) | ORAL | 2 refills | Status: DC | PRN

## 2016-08-23 NOTE — Discharge Summary (Signed)
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  Term Pregnancy Delivered and Anemia    Discharge diagnosis:    40w baby monitoring per dr request Principal Problem:   SVD (spontaneous vaginal delivery) Active Problems:   Positive GBS test   Elderly multigravida delivered   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 [id number] at [redacted]w[redacted]d was admitted to the hospital on 08/21/2016 for induction of labor. Indication for induction: Postdates, Favorable cervix at term and BPP 6/8.  Patient had an uncomplicated labor course as follows: Membrane Rupture Time/Date: 10:34 PM ,08/21/2016   Intrapartum Procedures: Episiotomy: None [1]                                         Lacerations:  None [1]  Patient had delivery of a Viable infant.  Information for the patient's newborn:  Shakera, Nicolich [id number]  Delivery Method: Vaginal, Spontaneous Delivery (Filed from Delivery Summary)   08/22/2016  Details of delivery can be found in separate delivery note. Patient had a routine postpartum course. Patient is discharged  home 08/23/16.   Physical Exam:   Vitals:   08/22/16 0730 08/22/16 1135 08/22/16 1757 08/23/16 0536  BP: 127/60 123/61 136/80 128/70  Pulse: 68 79 80 68  Resp: 16 18 17 18   Temp: 98.4 F (36.9 C) 98.9 F (37.2 C) 98 F (36.7 C) 98.2 F (36.8 C)  TempSrc: Oral Oral Oral Oral  SpO2: 100% 99%    Weight:      Height:       General: alert, cooperative and no distress Lochia: appropriate Uterine Fundus: firm Incision: N/A DVT Evaluation: No evidence of DVT seen on physical exam. Negative Homan's sign. No cords or calf tenderness. No significant calf/ankle edema.  Labs: Lab Results  Component Value Date   WBC 9.1 08/23/2016   HGB 9.3 (L) 08/23/2016   HCT 28.4 (L) 08/23/2016   MCV 77.2 (L) 08/23/2016   PLT 237 08/23/2016   CMP Latest Ref Rng & Units  07/30/2016  Glucose 65 - 99 mg/dL 99  BUN 6 - 20 mg/dL 10  Creatinine 0.44 - 1.00 mg/dL 0.66  Sodium 135 - 145 mmol/L 135  Potassium 3.5 - 5.1 mmol/L 3.4(L)  Chloride 101 - 111 mmol/L 107  CO2 22 - 32 mmol/L 22  Calcium 8.9 - 10.3 mg/dL 8.7(L)  Total Protein 6.5 - 8.1 g/dL 5.9(L)  Total Bilirubin 0.3 - 1.2 mg/dL 0.3  Alkaline Phos 38 - 126 U/L 93  AST 15 - 41 U/L 32  ALT 14 - 54 U/L 23   Prenatal labs: ABO, Rh: B/Positive/-- (03/08 0000) Antibody: Negative (03/08 0000) Rubella: Immune (03/08 0000) RPR: Nonreactive (03/08 0000)  HBsAg: Negative (03/08 0000)  HIV: Non-reactive (03/08 0000)  GBS: Positive (09/27 0000)   Discharge instruction: per After Visit Summary and "Baby and Me Booklet".  After Visit Meds:    Medication List    TAKE these medications   ferrous sulfate 325 (65 FE) MG tablet Take 1 tablet (325 mg total) by mouth 2 (two) times daily with a meal.   ibuprofen 600 MG tablet Commonly known as:  ADVIL,MOTRIN Take 1 tablet (600 mg total) by mouth every 6 (six) hours as needed for fever, headache, mild pain, moderate pain or cramping.       Diet: routine diet  Activity: Advance as tolerated.  Pelvic rest for 6 weeks.   Outpatient follow up:6 weeks Future Appointments Date Time Provider [facility]  08/24/2016 7:00 AM [facility] ROOM [facility] None    Postpartum contraception: Vasectomy  08/23/2016 Farrel Gordon, CNM

## 2016-08-23 NOTE — Discharge Instructions (Signed)
Breastfeeding and Mastitis Mastitis is inflammation of the breast tissue. It can occur in women who are breastfeeding. This can make breastfeeding painful. Mastitis will sometimes go away on its own. Your health care provider will help determine if treatment is needed. CAUSES Mastitis is often associated with a blocked milk (lactiferous) duct. This can happen when too much milk builds up in the breast. Causes of excess milk in the breast can include:  Poor latch-on. If your baby is not latched onto the breast properly, she or he may not empty your breast completely while breastfeeding.  Allowing too much time to pass between feedings.  Wearing a bra or other clothing that is too tight. This puts extra pressure on the lactiferous ducts so milk does not flow through them as it should. Mastitis can also be caused by a bacterial infection. Bacteria may enter the breast tissue through cuts or openings in the skin. In women who are breastfeeding, this may occur because of cracked or irritated skin. Cracks in the skin are often caused when your baby does not latch on properly to the breast. SIGNS AND SYMPTOMS  Swelling, redness, tenderness, and pain in an area of the breast.  Swelling of the glands under the arm on the same side.  Fever may or may not accompany mastitis. If an infection is allowed to progress, a collection of pus (abscess) may develop. DIAGNOSIS  Your health care provider can usually diagnose mastitis based on your symptoms and a physical exam. Tests may be done to help confirm the diagnosis. These may include:  Removal of pus from the breast by applying pressure to the area. This pus can be examined in the lab to determine which bacteria are present. If an abscess has developed, the fluid in the abscess can be removed with a needle. This can also be used to confirm the diagnosis and determine the bacteria present. In most cases, pus will not be present.  Blood tests to determine if  your body is fighting a bacterial infection.  Mammogram or ultrasound tests to rule out other problems or diseases. TREATMENT  Mastitis that occurs with breastfeeding will sometimes go away on its own. Your health care provider may choose to wait 24 hours after first seeing you to decide whether a prescription medicine is needed. If your symptoms are worse after 24 hours, your health care provider will likely prescribe an antibiotic medicine to treat the mastitis. He or she will determine which bacteria are most likely causing the infection and will then select an appropriate antibiotic medicine. This is sometimes changed based on the results of tests performed to identify the bacteria, or if there is no response to the antibiotic medicine selected. Antibiotic medicines are usually given by mouth. You may also be given medicine for pain. HOME CARE INSTRUCTIONS  Only take over-the-counter or prescription medicines for pain, fever, or discomfort as directed by your health care provider.  If your health care provider prescribed an antibiotic medicine, take the medicine as directed. Make sure you finish it even if you start to feel better.  Do not wear a tight or underwire bra. Wear a soft, supportive bra.  Increase your fluid intake, especially if you have a fever.  Continue to empty the breast. Your health care provider can tell you whether this milk is safe for your infant or needs to be thrown out. You may be told to stop nursing until your health care provider thinks it is safe for your baby.  Use a breast pump if you are advised to stop nursing.  Keep your nipples clean and dry.  Empty the first breast completely before going to the other breast. If your baby is not emptying your breasts completely for some reason, use a breast pump to empty your breasts.  If you go back to work, pump your breasts while at work to stay in time with your nursing schedule.  Avoid allowing your breasts to become  overly filled with milk (engorged). SEEK MEDICAL CARE IF:  You have pus-like discharge from the breast.  Your symptoms do not improve with the treatment prescribed by your health care provider within 2 days. SEEK IMMEDIATE MEDICAL CARE IF:  Your pain and swelling are getting worse.  You have pain that is not controlled with medicine.  You have a red line extending from the breast toward your armpit.  You have a fever or persistent symptoms for more than 2-3 days.  You have a fever and your symptoms suddenly get worse. MAKE SURE YOU:   Understand these instructions.  Will watch your condition.  Will get help right away if you are not doing well or get worse.   This information is not intended to replace advice given to you by your health care provider. Make sure you discuss any questions you have with your health care provider.   Document Released: 02/03/2005 Document Revised: 10/14/2013 Document Reviewed: 05/15/2013 Elsevier Interactive Patient Education [employer]. Breastfeeding Deciding to breastfeed is one of the best choices you can make for you and your baby. A change in hormones during pregnancy causes your breast tissue to grow and increases the number and size of your milk ducts. These hormones also allow proteins, sugars, and fats from your blood supply to make breast milk in your milk-producing glands. Hormones prevent breast milk from being released before your baby is born as well as prompt milk flow after birth. Once breastfeeding has begun, thoughts of your baby, as well as his or her sucking or crying, can stimulate the release of milk from your milk-producing glands.  BENEFITS OF BREASTFEEDING For Your Baby  Your first milk (colostrum) helps your baby's digestive system function better.  There are antibodies in your milk that help your baby fight off infections.  Your baby has a lower incidence of asthma, allergies, and sudden infant death  syndrome.  The nutrients in breast milk are better for your baby than infant formulas and are designed uniquely for your baby's needs.  Breast milk improves your baby's brain development.  Your baby is less likely to develop other conditions, such as childhood obesity, asthma, or type 2 diabetes mellitus. For You  Breastfeeding helps to create a very special bond between you and your baby.  Breastfeeding is convenient. Breast milk is always available at the correct temperature and costs nothing.  Breastfeeding helps to burn calories and helps you lose the weight gained during pregnancy.  Breastfeeding makes your uterus contract to its prepregnancy size faster and slows bleeding (lochia) after you give birth.   Breastfeeding helps to lower your risk of developing type 2 diabetes mellitus, osteoporosis, and breast or ovarian cancer later in life. SIGNS THAT YOUR BABY IS HUNGRY Early Signs of Hunger  Increased alertness or activity.  Stretching.  Movement of the head from side to side.  Movement of the head and opening of the mouth when the corner of the mouth or cheek is stroked (rooting).  Increased sucking sounds, smacking lips, cooing,  sighing, or squeaking.  Hand-to-mouth movements.  Increased sucking of fingers or hands. Late Signs of Hunger  Fussing.  Intermittent crying. Extreme Signs of Hunger Signs of extreme hunger will require calming and consoling before your baby will be able to breastfeed successfully. Do not wait for the following signs of extreme hunger to occur before you initiate breastfeeding:  Restlessness.  A loud, strong cry.  Screaming. BREASTFEEDING BASICS Breastfeeding Initiation  Find a comfortable place to sit or lie down, with your neck and back well supported.  Place a pillow or rolled up blanket under your baby to bring him or her to the level of your breast (if you are seated). Nursing pillows are specially designed to help support  your arms and your baby while you breastfeed.  Make sure that your baby's abdomen is facing your abdomen.  Gently massage your breast. With your fingertips, massage from your chest wall toward your nipple in a circular motion. This encourages milk flow. You may need to continue this action during the feeding if your milk flows slowly.  Support your breast with 4 fingers underneath and your thumb above your nipple. Make sure your fingers are well away from your nipple and your baby's mouth.  Stroke your baby's lips gently with your finger or nipple.  When your baby's mouth is open wide enough, quickly bring your baby to your breast, placing your entire nipple and as much of the colored area around your nipple (areola) as possible into your baby's mouth.  More areola should be visible above your baby's upper lip than below the lower lip.  Your baby's tongue should be between his or her lower gum and your breast.  Ensure that your baby's mouth is correctly positioned around your nipple (latched). Your baby's lips should create a seal on your breast and be turned out (everted).  It is common for your baby to suck about 2-3 minutes in order to start the flow of breast milk. Latching Teaching your baby how to latch on to your breast properly is very important. An improper latch can cause nipple pain and decreased milk supply for you and poor weight gain in your baby. Also, if your baby is not latched onto your nipple properly, he or she may swallow some air during feeding. This can make your baby fussy. Burping your baby when you switch breasts during the feeding can help to get rid of the air. However, teaching your baby to latch on properly is still the best way to prevent fussiness from swallowing air while breastfeeding. Signs that your baby has successfully latched on to your nipple:  Silent tugging or silent sucking, without causing you pain.  Swallowing heard between every 3-4  sucks.  Muscle movement above and in front of his or her ears while sucking. Signs that your baby has not successfully latched on to nipple:  Sucking sounds or smacking sounds from your baby while breastfeeding.  Nipple pain. If you think your baby has not latched on correctly, slip your finger into the corner of your baby's mouth to break the suction and place it between your baby's gums. Attempt breastfeeding initiation again. Signs of Successful Breastfeeding Signs from your baby:  A gradual decrease in the number of sucks or complete cessation of sucking.  Falling asleep.  Relaxation of his or her body.  Retention of a small amount of milk in his or her mouth.  Letting go of your breast by himself or herself. Signs from you:  Breasts that have increased in firmness, weight, and size 1-3 hours after feeding.  Breasts that are softer immediately after breastfeeding.  Increased milk volume, as well as a change in milk consistency and color by the fifth day of breastfeeding.  Nipples that are not sore, cracked, or bleeding. Signs That Your [employer] is Getting Enough Milk  Wetting at least 3 diapers in a 24-hour period. The urine should be clear and pale yellow by age 66 days.  At least 3 stools in a 24-hour period by age 66 days. The stool should be soft and yellow.  At least 3 stools in a 24-hour period by age 80 days. The stool should be seedy and yellow.  No loss of weight greater than 10% of birth weight during the first 1 days of age.  Average weight gain of 4-7 ounces (113-198 g) per week after age 30 days.  Consistent daily weight gain by age 82 days, without weight loss after the age of 2 weeks. After a feeding, your baby may spit up a small amount. This is common. BREASTFEEDING FREQUENCY AND DURATION Frequent feeding will help you make more milk and can prevent sore nipples and breast engorgement. Breastfeed when you feel the need to reduce the fullness of your breasts or  when your baby shows signs of hunger. This is called "breastfeeding on demand." Avoid introducing a pacifier to your baby while you are working to establish breastfeeding (the first 4-6 weeks after your baby is born). After this time you may choose to use a pacifier. Research has shown that pacifier use during the first year of a baby's life decreases the risk of sudden infant death syndrome (SIDS). Allow your baby to feed on each breast as long as he or she wants. Breastfeed until your baby is finished feeding. When your baby unlatches or falls asleep while feeding from the first breast, offer the second breast. Because newborns are often sleepy in the first few weeks of life, you may need to awaken your baby to get him or her to feed. Breastfeeding times will vary from baby to baby. However, the following rules can serve as a guide to help you ensure that your baby is properly fed:  Newborns (babies 82 weeks of age or younger) may breastfeed every 1-3 hours.  Newborns should not go longer than 3 hours during the day or 5 hours during the night without breastfeeding.  You should breastfeed your baby a minimum of 8 times in a 24-hour period until you begin to introduce solid foods to your baby at around 56 months of age. BREAST MILK PUMPING Pumping and storing breast milk allows you to ensure that your baby is exclusively fed your breast milk, even at times when you are unable to breastfeed. This is especially important if you are going back to work while you are still breastfeeding or when you are not able to be present during feedings. Your lactation consultant can give you guidelines on how long it is safe to store breast milk. A breast pump is a machine that allows you to pump milk from your breast into a sterile bottle. The pumped breast milk can then be stored in a refrigerator or freezer. Some breast pumps are operated by hand, while others use electricity. Ask your lactation consultant which type  will work best for you. Breast pumps can be purchased, but some hospitals and breastfeeding support groups lease breast pumps on a monthly basis. A lactation consultant can teach you  how to hand express breast milk, if you prefer not to use a pump. CARING FOR YOUR BREASTS WHILE YOU BREASTFEED Nipples can become dry, cracked, and sore while breastfeeding. The following recommendations can help keep your breasts moisturized and healthy:  Avoid using soap on your nipples.  Wear a supportive bra. Although not required, special nursing bras and tank tops are designed to allow access to your breasts for breastfeeding without taking off your entire bra or top. Avoid wearing underwire-style bras or extremely tight bras.  Air dry your nipples for 3-75mnutes after each feeding.  Use only cotton bra pads to absorb leaked breast milk. Leaking of breast milk between feedings is normal.  Use lanolin on your nipples after breastfeeding. Lanolin helps to maintain your skin's normal moisture barrier. If you use pure lanolin, you do not need to wash it off before feeding your baby again. Pure lanolin is not toxic to your baby. You may also hand express a few drops of breast milk and gently massage that milk into your nipples and allow the milk to air dry. In the first few weeks after giving birth, some women experience extremely full breasts (engorgement). Engorgement can make your breasts feel heavy, warm, and tender to the touch. Engorgement peaks within 3-5 days after you give birth. The following recommendations can help ease engorgement:  Completely empty your breasts while breastfeeding or pumping. You may want to start by applying warm, moist heat (in the shower or with warm water-soaked hand towels) just before feeding or pumping. This increases circulation and helps the milk flow. If your baby does not completely empty your breasts while breastfeeding, pump any extra milk after he or she is finished.  Wear  a snug bra (nursing or regular) or tank top for 1-2 days to signal your body to slightly decrease milk production.  Apply ice packs to your breasts, unless this is too uncomfortable for you.  Make sure that your baby is latched on and positioned properly while breastfeeding. If engorgement persists after 48 hours of following these recommendations, contact your health care provider or a lScience writer OVERALL HEALTH CARE RECOMMENDATIONS WHILE BREASTFEEDING  Eat healthy foods. Alternate between meals and snacks, eating 3 of each per day. Because what you eat affects your breast milk, some of the foods may make your baby more irritable than usual. Avoid eating these foods if you are sure that they are negatively affecting your baby.  Drink milk, fruit juice, and water to satisfy your thirst (about 10 glasses a day).  Rest often, relax, and continue to take your prenatal vitamins to prevent fatigue, stress, and anemia.  Continue breast self-awareness checks.  Avoid chewing and smoking tobacco. Chemicals from cigarettes that pass into breast milk and exposure to secondhand smoke may harm your baby.  Avoid alcohol and drug use, including marijuana. Some medicines that may be harmful to your baby can pass through breast milk. It is important to ask your health care provider before taking any medicine, including all over-the-counter and prescription medicine as well as vitamin and herbal supplements. It is possible to become pregnant while breastfeeding. If birth control is desired, ask your health care provider about options that will be safe for your baby. SEEK MEDICAL CARE IF:  You feel like you want to stop breastfeeding or have become frustrated with breastfeeding.  You have painful breasts or nipples.  Your nipples are cracked or bleeding.  Your breasts are red, tender, or warm.  You have a  swollen area on either breast.  You have a fever or chills.  You have nausea or  vomiting.  You have drainage other than breast milk from your nipples.  Your breasts do not become full before feedings by the fifth day after you give birth.  You feel sad and depressed.  Your baby is too sleepy to eat well.  Your baby is having trouble sleeping.   Your baby is wetting less than 3 diapers in a 24-hour period.  Your baby has less than 3 stools in a 24-hour period.  Your baby's skin or the white part of his or her eyes becomes yellow.   Your baby is not gaining weight by 17 days of age. SEEK IMMEDIATE MEDICAL CARE IF:  Your baby is overly tired (lethargic) and does not want to wake up and feed.  Your baby develops an unexplained fever.   This information is not intended to replace advice given to you by your health care provider. Make sure you discuss any questions you have with your health care provider.   Document Released: 10/09/2005 Document Revised: 06/30/2015 Document Reviewed: 04/02/2013 Elsevier Interactive Patient Education [employer]. Contraception Choices Contraception (birth control) is the use of any methods or devices to prevent pregnancy. Below are some methods to help avoid pregnancy. HORMONAL METHODS   Contraceptive implant. This is a thin, plastic tube containing progesterone hormone. It does not contain estrogen hormone. Your health care provider inserts the tube in the inner part of the upper arm. The tube can remain in place for up to 3 years. After 3 years, the implant must be removed. The implant prevents the ovaries from releasing an egg (ovulation), thickens the cervical mucus to prevent sperm from entering the uterus, and thins the lining of the inside of the uterus.  Progesterone-only injections. These injections are given every 3 months by your health care provider to prevent pregnancy. This synthetic progesterone hormone stops the ovaries from releasing eggs. It also thickens cervical mucus and changes the uterine lining. This  makes it harder for sperm to survive in the uterus.  Birth control pills. These pills contain estrogen and progesterone hormone. They work by preventing the ovaries from releasing eggs (ovulation). They also cause the cervical mucus to thicken, preventing the sperm from entering the uterus. Birth control pills are prescribed by a health care provider.Birth control pills can also be used to treat heavy periods.  Minipill. This type of birth control pill contains only the progesterone hormone. They are taken every day of each month and must be prescribed by your health care provider.  Birth control patch. The patch contains hormones similar to those in birth control pills. It must be changed once a week and is prescribed by a health care provider.  Vaginal ring. The ring contains hormones similar to those in birth control pills. It is left in the vagina for 3 weeks, removed for 1 week, and then a new one is put back in place. The patient must be comfortable inserting and removing the ring from the vagina.A health care provider's prescription is necessary.  Emergency contraception. Emergency contraceptives prevent pregnancy after unprotected sexual intercourse. This pill can be taken right after sex or up to 5 days after unprotected sex. It is most effective the sooner you take the pills after having sexual intercourse. Most emergency contraceptive pills are available without a prescription. Check with your pharmacist. Do not use emergency contraception as your only form of birth control. BARRIER METHODS  Female condom. This is a thin sheath (latex or rubber) that is worn over the penis during sexual intercourse. It can be used with spermicide to increase effectiveness.  Female condom. This is a soft, loose-fitting sheath that is put into the vagina before sexual intercourse.  Diaphragm. This is a soft, latex, dome-shaped barrier that must be fitted by a health care provider. It is inserted into the  vagina, along with a spermicidal jelly. It is inserted before intercourse. The diaphragm should be left in the vagina for 6 to 8 hours after intercourse.  Cervical cap. This is a round, soft, latex or plastic cup that fits over the cervix and must be fitted by a health care provider. The cap can be left in place for up to 48 hours after intercourse.  Sponge. This is a soft, circular piece of polyurethane foam. The sponge has spermicide in it. It is inserted into the vagina after wetting it and before sexual intercourse.  Spermicides. These are chemicals that kill or block sperm from entering the cervix and uterus. They come in the form of creams, jellies, suppositories, foam, or tablets. They do not require a prescription. They are inserted into the vagina with an applicator before having sexual intercourse. The process must be repeated every time you have sexual intercourse. INTRAUTERINE CONTRACEPTION  Intrauterine device (IUD). This is a T-shaped device that is put in a woman's uterus during a menstrual period to prevent pregnancy. There are 2 types:  Copper IUD. This type of IUD is wrapped in copper wire and is placed inside the uterus. Copper makes the uterus and fallopian tubes produce a fluid that kills sperm. It can stay in place for 10 years.  Hormone IUD. This type of IUD contains the hormone progestin (synthetic progesterone). The hormone thickens the cervical mucus and prevents sperm from entering the uterus, and it also thins the uterine lining to prevent implantation of a fertilized egg. The hormone can weaken or kill the sperm that get into the uterus. It can stay in place for 3-5 years, depending on which type of IUD is used. PERMANENT METHODS OF CONTRACEPTION  Female tubal ligation. This is when the woman's fallopian tubes are surgically sealed, tied, or blocked to prevent the egg from traveling to the uterus.  Hysteroscopic sterilization. This involves placing a small coil or  insert into each fallopian tube. Your doctor uses a technique called hysteroscopy to do the procedure. The device causes scar tissue to form. This results in permanent blockage of the fallopian tubes, so the sperm cannot fertilize the egg. It takes about 3 months after the procedure for the tubes to become blocked. You must use another form of birth control for these 3 months.  Female sterilization. This is when the female has the tubes that carry sperm tied off (vasectomy).This blocks sperm from entering the vagina during sexual intercourse. After the procedure, the man can still ejaculate fluid (semen). NATURAL PLANNING METHODS  Natural family planning. This is not having sexual intercourse or using a barrier method (condom, diaphragm, cervical cap) on days the woman could become pregnant.  Calendar method. This is keeping track of the length of each menstrual cycle and identifying when you are fertile.  Ovulation method. This is avoiding sexual intercourse during ovulation.  Symptothermal method. This is avoiding sexual intercourse during ovulation, using a thermometer and ovulation symptoms.  Post-ovulation method. This is timing sexual intercourse after you have ovulated. Regardless of which type or method of contraception you choose,  it is important that you use condoms to protect against the transmission of sexually transmitted infections (STIs). Talk with your health care provider about which form of contraception is most appropriate for you.   This information is not intended to replace advice given to you by your health care provider. Make sure you discuss any questions you have with your health care provider.   Document Released: 10/09/2005 Document Revised: 10/14/2013 Document Reviewed: 04/03/2013 Elsevier Interactive Patient Education 2016 [employer]. Iron-Rich Diet Iron is a mineral that helps your body to produce hemoglobin. Hemoglobin is a protein in your red blood cells that  carries oxygen to your body's tissues. Eating too little iron may cause you to feel weak and tired, and it can increase your risk for infection. Eating enough iron is necessary for your body's metabolism, muscle function, and nervous system. Iron is naturally found in many foods. It can also be added to foods or fortified in foods. There are two types of dietary iron:  Heme iron. Heme iron is absorbed by the body more easily than nonheme iron. Heme iron is found in meat, poultry, and fish.  Nonheme iron. Nonheme iron is found in dietary supplements, iron-fortified grains, beans, and vegetables. You may need to follow an iron-rich diet if:  You have been diagnosed with iron deficiency or iron-deficiency anemia.  You have a condition that prevents you from absorbing dietary iron, such as:  Infection in your intestines.  Celiac disease. This involves long-lasting (chronic) inflammation of your intestines.  You do not eat enough iron.  You eat a diet that is high in foods that impair iron absorption.  You have lost a lot of blood.  You have heavy bleeding during your menstrual cycle.  You are pregnant. WHAT IS MY PLAN? Your health care provider may help you to determine how much iron you need per day based on your condition. Generally, when a person consumes sufficient amounts of iron in the diet, the following iron needs are met:  Men.  69-74 years old: 11 mg per day.  69-46 years old: 8 mg per day.  Women.   40-87 years old: 15 mg per day.  18-56 years old: 18 mg per day.  Over 14 years old: 8 mg per day.  Pregnant women: 27 mg per day.  Breastfeeding women: 9 mg per day. WHAT DO I NEED TO KNOW ABOUT AN IRON-RICH DIET?  Eat fresh fruits and vegetables that are high in vitamin C along with foods that are high in iron. This will help increase the amount of iron that your body absorbs from food, especially with foods containing nonheme iron. Foods that are high in vitamin C  include oranges, peppers, tomatoes, and mango.  Take iron supplements only as directed by your health care provider. Overdose of iron can be life-threatening. If you were prescribed iron supplements, take them with orange juice or a vitamin C supplement.  Cook foods in pots and pans that are made from iron.   Eat nonheme iron-containing foods alongside foods that are high in heme iron. This helps to improve your iron absorption.   Certain foods and drinks contain compounds that impair iron absorption. Avoid eating these foods in the same meal as iron-rich foods or with iron supplements. These include:  Coffee, black tea, and red wine.  Milk, dairy products, and foods that are high in calcium.  Beans, soybeans, and peas.  Whole grains.  When eating foods that contain both nonheme iron and  compounds that impair iron absorption, follow these tips to absorb iron better.   Soak beans overnight before cooking.  Soak whole grains overnight and drain them before using.  Ferment flours before baking, such as using yeast in bread dough. WHAT FOODS CAN I EAT? Grains Iron-fortified breakfast cereal. Iron-fortified whole-wheat bread. Enriched rice. Sprouted grains. Vegetables Spinach. Potatoes with skin. Green peas. Broccoli. Red and green bell peppers. Fermented vegetables. Fruits Prunes. Raisins. Oranges. Strawberries. Mango. Grapefruit. Meats and Other Protein Sources Beef liver. Oysters. Beef. Shrimp. [place]. Chicken. [place]. Sardines. Chickpeas. Nuts. Tofu. Beverages Tomato juice. Fresh orange juice. Prune juice. Hibiscus tea. Fortified instant breakfast shakes. Condiments Tahini. Fermented soy sauce. Sweets and Desserts Black-strap molasses.  Other Wheat germ. The items listed above may not be a complete list of recommended foods or beverages. Contact your dietitian for more options. WHAT FOODS ARE NOT RECOMMENDED? Grains Whole grains. Bran cereal. Bran flour.  Oats. Vegetables Artichokes. Brussels sprouts. Kale. Fruits Blueberries. Raspberries. Strawberries. Figs. Meats and Other Protein Sources Soybeans. Products made from soy protein. Dairy Milk. Cream. Cheese. Yogurt. Cottage cheese. Beverages Coffee. Black tea. Red wine. Sweets and Desserts Cocoa. Chocolate. Ice cream. Other Basil. Oregano. Parsley. The items listed above may not be a complete list of foods and beverages to avoid. Contact your dietitian for more information.   This information is not intended to replace advice given to you by your health care provider. Make sure you discuss any questions you have with your health care provider.   Document Released: 05/23/2005 Document Revised: 10/30/2014 Document Reviewed: 05/06/2014 Elsevier Interactive Patient Education 2016 [employer]. Postpartum Depression and Baby Blues The postpartum period begins right after the birth of a baby. During this time, there is often a great amount of joy and excitement. It is also a time of many changes in the life of the parents. Regardless of how many times a mother gives birth, each child brings new challenges and dynamics to the family. It is not unusual to have feelings of excitement along with confusing shifts in moods, emotions, and thoughts. All mothers are at risk of developing postpartum depression or the "baby blues." These mood changes can occur right after giving birth, or they may occur many months after giving birth. The baby blues or postpartum depression can be mild or severe. Additionally, postpartum depression can go away rather quickly, or it can be a long-term condition.  CAUSES Raised hormone levels and the rapid drop in those levels are thought to be a main cause of postpartum depression and the baby blues. A number of hormones change during and after pregnancy. Estrogen and progesterone usually decrease right after the delivery of your baby. The levels of thyroid hormone and  various cortisol steroids also rapidly drop. Other factors that play a role in these mood changes include major life events and genetics.  RISK FACTORS If you have any of the following risks for the baby blues or postpartum depression, know what symptoms to watch out for during the postpartum period. Risk factors that may increase the likelihood of getting the baby blues or postpartum depression include:  Having a personal or family history of depression.   Having depression while being pregnant.   Having premenstrual mood issues or mood issues related to oral contraceptives.  Having a lot of life stress.   Having marital conflict.   Lacking a social support network.   Having a baby with special needs.   Having health problems, such as diabetes.  SIGNS AND  SYMPTOMS Symptoms of baby blues include:  Brief changes in mood, such as going from extreme happiness to sadness.  Decreased concentration.   Difficulty sleeping.   Crying spells, tearfulness.   Irritability.   Anxiety.  Symptoms of postpartum depression typically begin within the first month after giving birth. These symptoms include:  Difficulty sleeping or excessive sleepiness.   Marked weight loss.   Agitation.   Feelings of worthlessness.   Lack of interest in activity or food.  Postpartum psychosis is a very serious condition and can be dangerous. Fortunately, it is rare. Displaying any of the following symptoms is cause for immediate medical attention. Symptoms of postpartum psychosis include:   Hallucinations and delusions.   Bizarre or disorganized behavior.   Confusion or disorientation.  DIAGNOSIS  A diagnosis is made by an evaluation of your symptoms. There are no medical or lab tests that lead to a diagnosis, but there are various questionnaires that a health care provider may use to identify those with the baby blues, postpartum depression, or psychosis. Often, a screening tool  called the [place] Postnatal Depression Scale is used to diagnose depression in the postpartum period.  TREATMENT The baby blues usually goes away on its own in 1-2 weeks. Social support is often all that is needed. You will be encouraged to get adequate sleep and rest. Occasionally, you may be given medicines to help you sleep.  Postpartum depression requires treatment because it can last several months or longer if it is not treated. Treatment may include individual or group therapy, medicine, or both to address any social, physiological, and psychological factors that may play a role in the depression. Regular exercise, a healthy diet, rest, and social support may also be strongly recommended.  Postpartum psychosis is more serious and needs treatment right away. Hospitalization is often needed. HOME CARE INSTRUCTIONS  Get as much rest as you can. Nap when the baby sleeps.   Exercise regularly. Some women find yoga and walking to be beneficial.   Eat a balanced and nourishing diet.   Do little things that you enjoy. Have a cup of tea, take a bubble bath, read your favorite magazine, or listen to your favorite music.  Avoid alcohol.   Ask for help with household chores, cooking, grocery shopping, or running errands as needed. Do not try to do everything.   Talk to people close to you about how you are feeling. Get support from your partner, family members, friends, or other new moms.  Try to stay positive in how you think. Think about the things you are grateful for.   Do not spend a lot of time alone.   Only take over-the-counter or prescription medicine as directed by your health care provider.  Keep all your postpartum appointments.   Let your health care provider know if you have any concerns.  SEEK MEDICAL CARE IF: You are having a reaction to or problems with your medicine. SEEK IMMEDIATE MEDICAL CARE IF:  You have suicidal feelings.   You think you may harm the  baby or someone else. MAKE SURE YOU:  Understand these instructions.  Will watch your condition.  Will get help right away if you are not doing well or get worse.   This information is not intended to replace advice given to you by your health care provider. Make sure you discuss any questions you have with your health care provider.   Document Released: 07/13/2004 Document Revised: 10/14/2013 Document Reviewed: 07/21/2013 Elsevier Interactive Patient  Education 2016 [place]. Postpartum Care After Vaginal Delivery After you deliver your newborn (postpartum period), the usual stay in the hospital is 24-72 hours. If there were problems with your labor or delivery, or if you have other medical problems, you might be in the hospital longer.  While you are in the hospital, you will receive help and instructions on how to care for yourself and your newborn during the postpartum period.  While you are in the hospital:  Be sure to tell your nurses if you have pain or discomfort, as well as where you feel the pain and what makes the pain worse.  If you had an incision made near your vagina (episiotomy) or if you had some tearing during delivery, the nurses may put ice packs on your episiotomy or tear. The ice packs may help to reduce the pain and swelling.  If you are breastfeeding, you may feel uncomfortable contractions of your uterus for a couple of weeks. This is normal. The contractions help your uterus get back to normal size.  It is normal to have some bleeding after delivery.  For the first 1-3 days after delivery, the flow is red and the amount may be similar to a period.  It is common for the flow to start and stop.  In the first few days, you may pass some small clots. Let your nurses know if you begin to pass large clots or your flow increases.  Do not  flush blood clots down the toilet before having the nurse look at them.  During the next 3-10 days after delivery, your  flow should become more watery and pink or brown-tinged in color.  Ten to fourteen days after delivery, your flow should be a small amount of yellowish-white discharge.  The amount of your flow will decrease over the first few weeks after delivery. Your flow may stop in 6-8 weeks. Most women have had their flow stop by 12 weeks after delivery.  You should change your sanitary pads frequently.  Wash your hands thoroughly with soap and water for at least 20 seconds after changing pads, using the toilet, or before holding or feeding your newborn.  You should feel like you need to empty your bladder within the first 6-8 hours after delivery.  In case you become weak, lightheaded, or faint, call your nurse before you get out of bed for the first time and before you take a shower for the first time.  Within the first few days after delivery, your breasts may begin to feel tender and full. This is called engorgement. Breast tenderness usually goes away within 48-72 hours after engorgement occurs. You may also notice milk leaking from your breasts. If you are not breastfeeding, do not stimulate your breasts. Breast stimulation can make your breasts produce more milk.  Spending as much time as possible with your newborn is very important. During this time, you and your newborn can feel close and get to know each other. Having your newborn stay in your room (rooming in) will help to strengthen the bond with your newborn. It will give you time to get to know your newborn and become comfortable caring for your newborn.  Your hormones change after delivery. Sometimes the hormone changes can temporarily cause you to feel sad or tearful. These feelings should not last more than a few days. If these feelings last longer than that, you should talk to your caregiver.  If desired, talk to your caregiver about methods of  family planning or contraception. °· Talk to your caregiver about immunizations. Your caregiver  may want you to have the following immunizations before leaving the hospital: °¨ Tetanus, diphtheria, and pertussis (Tdap) or tetanus and diphtheria (Td) immunization. It is very important that you and your family (including grandparents) or others caring for your newborn are up-to-date with the Tdap or Td immunizations. The Tdap or Td immunization can help protect your newborn from getting ill. °¨ Rubella immunization. °¨ Varicella (chickenpox) immunization. °¨ Influenza immunization. You should receive this annual immunization if you did not receive the immunization during your pregnancy. °  °This information is not intended to replace advice given to you by your health care provider. Make sure you discuss any questions you have with your health care provider. °  °Document Released: 08/06/2007 Document Revised: 07/03/2012 Document Reviewed: 06/05/2012 °Elsevier Interactive Patient Education ©2016 [employer] ° ° °

## 2016-08-23 NOTE — Lactation Note (Addendum)
This note was copied from a baby's chart. Lactation Consultation Note  Patient Name: Girl Marvelyn Plater [id number] Date: 08/23/2016 Reason for consult: Follow-up assessment  This is Mom's 4th child. She reports that nursing is going well. She has no complaints about latch or sore nipples.  Lactation hx: Mom nursed her 1st 2 children for about 4 months, but only nursed the 6rd child for 2 weeks b/c she became sick w/a stomach virus. Around that same time, the baby's face(?) became swollen and when she took the baby to the ER, she was told maybe something was getting to the baby in the breast milk. I educated mom that even in the presence of a stomach virus, breast milk is still the safest source of nutrition.   Mom was taught signs/sound of swallowing. I reminded her about our BFSGs & outpatient appts.  Matthias Hughs [facility] 08/23/2016, 10:42 AM

## 2016-08-24 ENCOUNTER — Inpatient Hospital Stay (HOSPITAL_COMMUNITY): Admission: RE | Admit: 2016-08-24 | Payer: BLUE CROSS/BLUE SHIELD | Source: Ambulatory Visit

## 2016-11-02 ENCOUNTER — Encounter (HOSPITAL_COMMUNITY): Payer: Self-pay

## 2016-11-02 ENCOUNTER — Emergency Department (HOSPITAL_COMMUNITY): Payer: BLUE CROSS/BLUE SHIELD

## 2016-11-02 DIAGNOSIS — Z79899 Other long term (current) drug therapy: Secondary | ICD-10-CM | POA: Insufficient documentation

## 2016-11-02 DIAGNOSIS — R0789 Other chest pain: Secondary | ICD-10-CM | POA: Insufficient documentation

## 2016-11-02 DIAGNOSIS — I10 Essential (primary) hypertension: Secondary | ICD-10-CM | POA: Insufficient documentation

## 2016-11-02 LAB — CBC
HCT: 37.4 % (ref 36.0–46.0)
Hemoglobin: 12.5 g/dL (ref 12.0–15.0)
MCH: 26.3 pg (ref 26.0–34.0)
MCHC: 33.4 g/dL (ref 30.0–36.0)
MCV: 78.7 fL (ref 78.0–100.0)
Platelets: 333 10*3/uL (ref 150–400)
RBC: 4.75 MIL/uL (ref 3.87–5.11)
RDW: 16.2 % — ABNORMAL HIGH (ref 11.5–15.5)
WBC: 6 10*3/uL (ref 4.0–10.5)

## 2016-11-02 LAB — BASIC METABOLIC PANEL
Anion gap: 9 (ref 5–15)
BUN: 10 mg/dL (ref 6–20)
CO2: 23 mmol/L (ref 22–32)
CREATININE: 0.68 mg/dL (ref 0.44–1.00)
Calcium: 9.5 mg/dL (ref 8.9–10.3)
Chloride: 105 mmol/L (ref 101–111)
Glucose, Bld: 93 mg/dL (ref 65–99)
Potassium: 4 mmol/L (ref 3.5–5.1)
SODIUM: 137 mmol/L (ref 135–145)

## 2016-11-02 LAB — I-STAT TROPONIN, ED: TROPONIN I, POC: 0.01 ng/mL (ref 0.00–0.08)

## 2016-11-02 LAB — I-STAT BETA HCG BLOOD, ED (MC, WL, AP ONLY): I-stat hCG, quantitative: <5 m[IU]/mL (ref <5)

## 2016-11-02 NOTE — ED Triage Notes (Signed)
Pt from home c/o centralized chest pain and R arm and neck numbness. Pt recently (3 days ago) started on Labetalol for HTN. Endorses dizziness. Denies SOB at this time. A&Ox4. Ambulatory.

## 2016-11-03 ENCOUNTER — Emergency Department (HOSPITAL_COMMUNITY)
Admission: EM | Admit: 2016-11-03 | Discharge: 2016-11-03 | Disposition: A | Discharge status: Home / Self Care | Payer: BLUE CROSS/BLUE SHIELD | Attending: Emergency Medicine | Admitting: Emergency Medicine

## 2016-11-03 DIAGNOSIS — I10 Essential (primary) hypertension: Secondary | ICD-10-CM

## 2016-11-03 DIAGNOSIS — R079 Chest pain, unspecified: Secondary | ICD-10-CM

## 2016-11-03 LAB — I-STAT TROPONIN, ED: TROPONIN I, POC: 0.01 ng/mL (ref 0.00–0.08)

## 2016-11-03 LAB — D-DIMER, QUANTITATIVE: D-Dimer, Quant: <0.27 ug/mL-FEU (ref 0.00–0.50)

## 2016-11-03 MED ORDER — NAPROXEN 500 MG PO TABS: 500.0000 mg | ORAL_TABLET | Freq: Two times a day (BID) | ORAL | 0 refills | Status: DC

## 2016-11-03 MED ORDER — NAPROXEN 500 MG PO TABS
500.0000 mg | ORAL_TABLET | Freq: Once | ORAL | Status: AC
  Administered 2016-11-03: 500 mg via ORAL
  Filled 2016-11-03: qty 1

## 2016-11-03 NOTE — Discharge Instructions (Signed)
Please read and follow all provided instructions.  Your diagnoses today include:  1. Chest pain, unspecified type     Tests performed today include: An EKG of your heart A chest x-ray Cardiac enzymes - a blood test for heart muscle damage Blood counts and electrolytes Vital signs. See below for your results today.   Medications prescribed:   Take any prescribed medications only as directed.  Follow-up instructions: Please follow-up with your primary care provider as soon as you can for further evaluation of your symptoms.   Return instructions:  SEEK IMMEDIATE MEDICAL ATTENTION IF: You have severe chest pain, especially if the pain is crushing or pressure-like and spreads to the arms, back, neck, or jaw, or if you have sweating, nausea (feeling sick to your stomach), or shortness of breath. THIS IS AN EMERGENCY. Don't wait to see if the pain will go away. Get medical help at once. Call 911 or [phone] (operator). DO NOT drive yourself to the hospital.  Your chest pain gets worse and does not go away with rest.  You have an attack of chest pain lasting longer than usual, despite rest and treatment with the medications your caregiver has prescribed.  You wake from sleep with chest pain or shortness of breath. You feel dizzy or faint. You have chest pain not typical of your usual pain for which you originally saw your caregiver.  You have any other emergent concerns regarding your health.  Additional Information: Chest pain comes from many different causes. Your caregiver has diagnosed you as having chest pain that is not specific for one problem, but does not require admission.  You are at low risk for an acute heart condition or other serious illness.   Your vital signs today were: BP (!) 152/102 (BP Location: Left Arm)    Pulse 65    Temp 98.7 F (37.1 C) (Oral)    Resp 18    SpO2 100%  If your blood pressure (BP) was elevated above 135/85 this visit, please have this repeated by your  doctor within one month. --------------

## 2016-11-03 NOTE — ED Provider Notes (Signed)
[facility] DEPT Provider Note   CSN: [id number] Arrival date & time: 11/02/16  2010 By signing my name below, I, Beth Walker, attest that this documentation has been prepared under the direction and in the presence of non-physician practitioner, Shary Decamp, PA-C. Electronically Signed: Dyke Walker, Scribe. 11/03/2016. 1:55 AM.   History   Chief Complaint Chief Complaint  Patient presents with  . Chest Pain    HPI Beth Walker is a 36 y.o. female with a hx of HTN who presents to the Emergency Department complaining of intermittent, moderate centralized chest pain onset today. She describes her pain as pressure and states it feels like an elephant is sitting on her chest. She notes associated dizziness, numbness to her right hand snd pain to her neck, right arm and bilateral legs. She rates her neck pain 9/10 and describes her myalgias as aching. She states she had similar leg pain when she was pregnant. Pt was seen at [facility] five days ago and was treated for hypertension. She was Rx Labetalol 200mg  2x day three days ago and told to follow with her PCP. She has taken her Labetalol today with no relief of symptoms. Ne alleviating or modifying factors noted. No family hx of heart attacks or personal history of blood clots.   The history is provided by the patient. No language interpreter was used.   Past Medical History:  Diagnosis Date  . Fibroids    history  . Hypertension   . Infection    UTI  . SVD (spontaneous vaginal delivery)    x 3  . Termination of pregnancy 2002   x 1  . UTI (lower urinary tract infection)    just finished abx for uti - 7 day treatment    Patient Active Problem List   Diagnosis Date Noted  . Positive GBS test 08/23/2016  . Elderly multigravida delivered 08/23/2016  . 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 SVD (spontaneous vaginal delivery) 08/22/2016  . Hx of Fibroids in past 04/22/2013    Past Surgical History:    Procedure Laterality Date  . DILATION AND CURETTAGE OF UTERUS    . DILATION AND EVACUATION N/A 04/23/2013   Procedure: DILATATION AND EVACUATION;  Surgeon: Betsy Coder, MD;  Location: [facility];  Service: Gynecology;  Laterality: N/A;  . WISDOM TOOTH EXTRACTION      OB History    Gravida Para Term Preterm AB Living   7 4 4   3 4    SAB TAB Ectopic Multiple Live Births   2 1   0 4       Home Medications    Prior to Admission medications   Medication Sig Start Date End Date Taking? Authorizing Provider  ferrous sulfate 325 (65 FE) MG tablet Take 1 tablet (325 mg total) by mouth 2 (two) times daily with a meal. 08/23/16   Farrel Gordon, CNM  ibuprofen (ADVIL,MOTRIN) 600 MG tablet Take 1 tablet (600 mg total) by mouth every 6 (six) hours as needed for fever, headache, mild pain, moderate pain or cramping. 08/23/16   Farrel Gordon, CNM    Family History Family History  Problem Relation Age of Onset  . Hypertension Mother   . Hypertension Father   . Cancer Maternal Grandmother   . Stroke Paternal Grandmother   . Diabetes Paternal Grandfather     Social History Social History  Substance Use Topics  . Smoking status: Never Smoker  . Smokeless tobacco: Never Used  .  Alcohol use Yes     Comment: socially but none with pregnancy     Allergies   Pineapple   Review of Systems Review of Systems 10 systems reviewed and all are negative for acute change except as noted in the HPI.   Physical Exam Updated Vital Signs BP (!) 186/112 (BP Location: Left Arm)   Pulse 69   Temp 98.7 F (37.1 C) (Oral)   Resp 18   SpO2 100%   Physical Exam  Constitutional: She is oriented to person, place, and time. Vital signs are normal. She appears well-developed and well-nourished.  HENT:  Head: Normocephalic and atraumatic.  Right Ear: Hearing normal.  Left Ear: Hearing normal.  Eyes: Conjunctivae and EOM are normal. Pupils are equal, round, and reactive to light.  Neck: Normal  range of motion. Neck supple.  Cardiovascular: Normal rate, regular rhythm, normal heart sounds, intact distal pulses and normal pulses.   Pulmonary/Chest: Effort normal and breath sounds normal. She has no decreased breath sounds. She has no wheezes. She has no rhonchi. She has no rales. She exhibits tenderness.    Cental anterior chest wall TTP. No palpable or visible deformities.   Abdominal: Soft. There is no tenderness.  Musculoskeletal: Normal range of motion.  Neurological: She is alert and oriented to person, place, and time.  Skin: Skin is warm and dry.  Psychiatric: She has a normal mood and affect. Her speech is normal and behavior is normal. Thought content normal.  Nursing note and vitals reviewed.   ED Treatments / Results    COORDINATION OF CARE:  1:45 AM Discussed treatment plan with pt at bedside and pt agreed to plan.  Labs (all labs ordered are listed, but only abnormal results are displayed) Labs Reviewed  CBC - Abnormal; Notable for the following:       Result Value   RDW 16.2 (*)    All other components within normal limits  BASIC METABOLIC PANEL  D-DIMER, QUANTITATIVE (NOT AT [facility])  TSH  I-STAT TROPOININ, ED  I-STAT BETA HCG BLOOD, ED ([place], [facility], [facility] ONLY)  I-STAT TROPOININ, ED   EKG  EKG Interpretation None       Radiology Dg Chest 2 View  Result Date: 11/02/2016 CLINICAL DATA:  Chronic worsening generalized chest pain. Initial encounter. EXAM: CHEST  2 VIEW COMPARISON:  Chest radiograph performed 10/29/2016 FINDINGS: The lungs are well-aerated and clear. There is no evidence of focal opacification, pleural effusion or pneumothorax. The heart is normal in size; the mediastinal contour is within normal limits. No acute osseous abnormalities are seen. IMPRESSION: No acute cardiopulmonary process seen. Electronically Signed   By: Garald Balding M.D.   On: 11/02/2016 21:04    Procedures Procedures (including critical care time)  Medications Ordered in  ED Medications - No data to display   Initial Impression / Assessment and Plan / ED Course  I have reviewed the triage vital signs and the nursing notes.  Pertinent labs & imaging results that were available during my care of the patient were reviewed by me and considered in my medical decision making (see chart for details).  Clinical Course    Final Clinical Impressions(s) / ED Diagnoses  {I have reviewed and evaluated the relevant laboratory values. {I have reviewed and evaluated the relevant imaging studies. {I have interpreted the relevant EKG. {I have reviewed the relevant previous healthcare records. {I have reviewed EMS Documentation. {I obtained HPI from historian.  ED Course:  Assessment: Pt is a 35yF  presents with CP today. Hx same. No N/V. No diaphoresis. Risk Factors HTN. Recently started on new BP meds. Pt here concerned for BP. Patient is to be discharged with recommendation to follow up with PCP in regards to today's hospital visit. Chest pain is not likely of cardiac or pulmonary etiology d/t presentation, perc negative, VSS, no tracheal deviation, no JVD or new murmur, RRR, breath sounds equal bilaterally, EKG without acute abnormalities, negative troponin x2, and negative CXR. D Dimer negative Pt has been advised start a NSAIDs and return to the ED is CP becomes exertional, associated with diaphoresis or nausea, radiates to left jaw/arm, worsens or becomes concerning in any way. Pt appears reliable for follow up and is agreeable to discharge. Patient is in no acute distress. Vital Signs are stable. Patient is able to ambulate. Patient able to tolerate PO.   Disposition/Plan:  DC Home Additional Verbal discharge instructions given and discussed with patient.  Pt Instructed to f/u with PCP in the next week for evaluation and treatment of symptoms. Return precautions given Pt acknowledges and agrees with plan  Supervising Physician Veryl Speak, MD   Final diagnoses:    Chest pain, unspecified type    New Prescriptions New Prescriptions   No medications on file   I personally performed the services described in this documentation, which was scribed in my presence. The recorded information has been reviewed and is accurate.    Shary Decamp, PA-C 11/03/16 0321    Veryl Speak, MD 11/03/16 [phone]

## 2016-11-04 ENCOUNTER — Encounter (HOSPITAL_BASED_OUTPATIENT_CLINIC_OR_DEPARTMENT_OTHER): Payer: Self-pay | Admitting: Emergency Medicine

## 2016-11-04 DIAGNOSIS — I10 Essential (primary) hypertension: Secondary | ICD-10-CM | POA: Insufficient documentation

## 2016-11-04 DIAGNOSIS — Z791 Long term (current) use of non-steroidal anti-inflammatories (NSAID): Secondary | ICD-10-CM | POA: Insufficient documentation

## 2016-11-04 DIAGNOSIS — R0789 Other chest pain: Secondary | ICD-10-CM | POA: Insufficient documentation

## 2016-11-04 DIAGNOSIS — Z79899 Other long term (current) drug therapy: Secondary | ICD-10-CM | POA: Insufficient documentation

## 2016-11-04 DIAGNOSIS — R0602 Shortness of breath: Secondary | ICD-10-CM | POA: Insufficient documentation

## 2016-11-04 NOTE — ED Triage Notes (Signed)
Pt states she is having another episode of chest pain, seen a few days ago at [facility] but hurts worse today.

## 2016-11-04 NOTE — ED Triage Notes (Signed)
Pt seen at [facility] ED 2 days ago for same complaint, said they couldn't find anything.

## 2016-11-05 ENCOUNTER — Emergency Department (HOSPITAL_BASED_OUTPATIENT_CLINIC_OR_DEPARTMENT_OTHER)
Admission: EM | Admit: 2016-11-05 | Discharge: 2016-11-05 | Disposition: A | Discharge status: Home / Self Care | Payer: Self-pay | Attending: Emergency Medicine | Admitting: Emergency Medicine

## 2016-11-05 ENCOUNTER — Emergency Department (HOSPITAL_BASED_OUTPATIENT_CLINIC_OR_DEPARTMENT_OTHER): Payer: Self-pay

## 2016-11-05 DIAGNOSIS — R079 Chest pain, unspecified: Secondary | ICD-10-CM

## 2016-11-05 DIAGNOSIS — I10 Essential (primary) hypertension: Secondary | ICD-10-CM

## 2016-11-05 LAB — CBC
HEMATOCRIT: 37.4 % (ref 36.0–46.0)
Hemoglobin: 12.4 g/dL (ref 12.0–15.0)
MCH: 26.6 pg (ref 26.0–34.0)
MCHC: 33.2 g/dL (ref 30.0–36.0)
MCV: 80.1 fL (ref 78.0–100.0)
Platelets: 302 10*3/uL (ref 150–400)
RBC: 4.67 MIL/uL (ref 3.87–5.11)
RDW: 16.4 % — ABNORMAL HIGH (ref 11.5–15.5)
WBC: 4.9 10*3/uL (ref 4.0–10.5)

## 2016-11-05 LAB — BASIC METABOLIC PANEL
Anion gap: 9 (ref 5–15)
BUN: 7 mg/dL (ref 6–20)
CHLORIDE: 103 mmol/L (ref 101–111)
CO2: 24 mmol/L (ref 22–32)
Calcium: 9.5 mg/dL (ref 8.9–10.3)
Creatinine, Ser: 0.63 mg/dL (ref 0.44–1.00)
GFR calc Af Amer: >60 mL/min (ref >60)
GFR calc non Af Amer: >60 mL/min (ref >60)
GLUCOSE: 84 mg/dL (ref 65–99)
Potassium: 3.8 mmol/L (ref 3.5–5.1)
Sodium: 136 mmol/L (ref 135–145)

## 2016-11-05 LAB — TROPONIN I: Troponin I: <0.03 ng/mL (ref <0.03)

## 2016-11-05 LAB — D-DIMER, QUANTITATIVE (NOT AT ARMC)

## 2016-11-05 LAB — BRAIN NATRIURETIC PEPTIDE: B Natriuretic Peptide: 10.3 pg/mL (ref 0.0–100.0)

## 2016-11-05 MED ORDER — ACETAMINOPHEN 500 MG PO TABS
1000.0000 mg | ORAL_TABLET | Freq: Once | ORAL | Status: AC
  Administered 2016-11-05: 1000 mg via ORAL
  Filled 2016-11-05: qty 2

## 2016-11-05 NOTE — ED Notes (Signed)
Patient transported to X-ray 

## 2016-11-05 NOTE — ED Notes (Signed)
Pt states when she ambulated to the bathroom, it helped the pressure and heaviness in her chest. Now just feel like she needs to belch. Also states she "startles" when she falls asleep and it frightens her to the point she is afraid to go to sleep. Reassured.

## 2016-11-05 NOTE — ED Notes (Addendum)
Pt states lower sternal chest pain and SOB for past week.  Pt states she was seen 4 times for similar complaints this week and thought it was related to her BP being elevated, but pt states today her BP has been lower than previous days.  Pt also c/o pains in right leg, right arm, and neck that "seems to move around my body at different times."  Pt is 10 weeks postpartum.

## 2016-11-05 NOTE — ED Notes (Signed)
MD with pt  

## 2016-11-05 NOTE — Discharge Instructions (Signed)
If you were given medicines take as directed.  If you are on coumadin or contraceptives realize their levels and effectiveness is altered by many different medicines.  If you have any reaction (rash, tongues swelling, other) to the medicines stop taking and see a physician.    If your blood pressure was elevated in the ER make sure you follow up for management with a primary doctor or return for chest pain, shortness of breath or stroke symptoms.  Please follow up as directed and return to the ER or see a physician for new or worsening symptoms.  Thank you. Vitals:   11/04/16 2345 11/05/16 0134  BP: 157/98 162/92  Pulse: 71 66  Resp: 18 20  Temp: 98 F (36.7 C) 97.8 F (36.6 C)  TempSrc: Oral Oral  SpO2: 100% 100%  Weight: 164 lb (74.4 kg)   Height: 5' 6.5" (1.689 m)

## 2016-11-05 NOTE — ED Provider Notes (Signed)
[facility] DEPT [facility] Provider Note   CSN: [id number] Arrival date & time: 11/04/16  2340     History   Chief Complaint Chief Complaint  Patient presents with  . Chest Pain    HPI Beth Walker is a 36 y.o. female.  Patient presents with intermittent chest pressure mild radiation to the right arm and neck since this morning. Currently symptoms mild. Initially heavier sensation. No history of cardiac or blood clot. Patient has a history of high blood pressure take labetalol. Recent pregnancy. No unilateral leg swelling. Mild shortness of breath.   The history is provided by the patient.  Chest Pain   Associated symptoms include shortness of breath. Pertinent negatives include no abdominal pain, no back pain, no fever, no headaches and no vomiting.    Past Medical History:  Diagnosis Date  . Fibroids    history  . Hypertension   . Infection    UTI  . SVD (spontaneous vaginal delivery)    x 3  . Termination of pregnancy 2002   x 1  . UTI (lower urinary tract infection)    just finished abx for uti - 7 day treatment    Patient Active Problem List   Diagnosis Date Noted  . Positive GBS test 08/23/2016  . Elderly multigravida delivered 08/23/2016  . 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 SVD (spontaneous vaginal delivery) 08/22/2016  . Hx of Fibroids in past 04/22/2013    Past Surgical History:  Procedure Laterality Date  . DILATION AND CURETTAGE OF UTERUS    . DILATION AND EVACUATION N/A 04/23/2013   Procedure: DILATATION AND EVACUATION;  Surgeon: Betsy Coder, MD;  Location: [facility];  Service: Gynecology;  Laterality: N/A;  . WISDOM TOOTH EXTRACTION      OB History    Gravida Para Term Preterm AB Living   7 4 4   3 4    SAB TAB Ectopic Multiple Live Births   2 1   0 4       Home Medications    Prior to Admission medications   Medication Sig Start Date End Date Taking? Authorizing Provider  ferrous sulfate 325 (65 FE) MG tablet Take  1 tablet (325 mg total) by mouth 2 (two) times daily with a meal. Patient not taking: Reported on 11/03/2016 08/23/16   Farrel Gordon, CNM  ferrous sulfate 325 (65 FE) MG tablet Take 1 tablet by mouth 2 (two) times daily. 08/23/16   Historical Provider, MD  ibuprofen (ADVIL,MOTRIN) 600 MG tablet Take 1 tablet (600 mg total) by mouth every 6 (six) hours as needed for fever, headache, mild pain, moderate pain or cramping. Patient not taking: Reported on 11/03/2016 08/23/16   Farrel Gordon, CNM  labetalol (NORMODYNE) 200 MG tablet Take 200 mg by mouth 2 (two) times daily. 10/29/16 11/28/16  Historical Provider, MD  naproxen (NAPROSYN) 500 MG tablet Take 1 tablet (500 mg total) by mouth 2 (two) times daily. 11/03/16   Shary Decamp, PA-C    Family History Family History  Problem Relation Age of Onset  . Hypertension Mother   . Hypertension Father   . Cancer Maternal Grandmother   . Stroke Paternal Grandmother   . Diabetes Paternal Grandfather     Social History Social History  Substance Use Topics  . Smoking status: Never Smoker  . Smokeless tobacco: Never Used  . Alcohol use Yes     Comment: socially but none with pregnancy     Allergies  Pineapple   Review of Systems Review of Systems  Constitutional: Negative for chills and fever.  HENT: Negative for congestion.   Eyes: Negative for visual disturbance.  Respiratory: Positive for shortness of breath.   Cardiovascular: Positive for chest pain.  Gastrointestinal: Negative for abdominal pain and vomiting.  Genitourinary: Negative for dysuria and flank pain.  Musculoskeletal: Negative for back pain, neck pain and neck stiffness.  Skin: Negative for rash.  Neurological: Negative for light-headedness and headaches.     Physical Exam Updated Vital Signs BP 177/100   Pulse 64   Temp 97.8 F (36.6 C) (Oral)   Resp 22   Ht 5' 6.5" (1.689 m)   Wt 164 lb (74.4 kg)   SpO2 100%   BMI 26.07 kg/m   Physical Exam    Constitutional: She appears well-developed and well-nourished. No distress.  HENT:  Head: Normocephalic and atraumatic.  Eyes: Conjunctivae are normal.  Neck: Neck supple.  Cardiovascular: Normal rate, regular rhythm, normal heart sounds and intact distal pulses.   No murmur heard. Pulmonary/Chest: Effort normal and breath sounds normal. No respiratory distress.  Abdominal: Soft. There is no tenderness.  Musculoskeletal: She exhibits no edema.  Neurological: She is alert.  Skin: Skin is warm and dry.  Psychiatric: She has a normal mood and affect.  Nursing note and vitals reviewed.    ED Treatments / Results  Labs (all labs ordered are listed, but only abnormal results are displayed) Labs Reviewed  CBC - Abnormal; Notable for the following:       Result Value   RDW 16.4 (*)    All other components within normal limits  BASIC METABOLIC PANEL  TROPONIN I  BRAIN NATRIURETIC PEPTIDE  D-DIMER, QUANTITATIVE (NOT AT [facility])  TROPONIN I    EKG  EKG Interpretation  Date/Time:  Saturday November 04 2016 23:47:41 EST Ventricular Rate:  64 PR Interval:  138 QRS Duration: 94 QT Interval:  420 QTC Calculation: 433 R Axis:   83 Text Interpretation:  Normal sinus rhythm Normal ECG Confirmed by Keziah Avis MD, Ludie Pavlik [phone]) on 11/05/2016 1:07:12 AM       Radiology Dg Chest 2 View  Result Date: 11/05/2016 CLINICAL DATA:  Acute onset of lower sternal chest pain and shortness of breath. Initial encounter. EXAM: CHEST  2 VIEW COMPARISON:  Chest radiograph performed 11/02/2016 FINDINGS: The lungs are well-aerated and clear. There is no evidence of focal opacification, pleural effusion or pneumothorax. The heart is normal in size; the mediastinal contour is within normal limits. No acute osseous abnormalities are seen. IMPRESSION: No acute cardiopulmonary process seen. Electronically Signed   By: Garald Balding M.D.   On: 11/05/2016 01:58    Procedures Procedures (including critical care  time)   EMERGENCY DEPARTMENT [place] CARDIAC EXAM "Study: Limited Ultrasound of the heart and pericardium"  INDICATIONS:Dyspnea Multiple views of the heart and pericardium were obtained in real-time with a multi-frequency probe.  PERFORMED [id number]  IMAGES ARCHIVED?: Yes  FINDINGS: No pericardial effusion, Normal contractility and Tamponade physiology absent  LIMITATIONS:  Body habitus  VIEWS USED: Subcostal 4 chamber, Parasternal long axis, Parasternal short axis and Apical 4 chamber   INTERPRETATION: Cardiac activity present, Pericardial effusioin absent, Cardiac tamponade absent and Normal contractility  CPT Code: [id number] (limited transthoracic cardiac)  Medications Ordered in ED Medications  acetaminophen (TYLENOL) tablet 1,000 mg (1,000 mg Oral Given 11/05/16 0240)     Initial Impression / Assessment and Plan / ED Course  I have reviewed the triage vital signs  and the nursing notes.  Pertinent labs & imaging results that were available during my care of the patient were reviewed by me and considered in my medical decision making (see chart for details).  Clinical Course    Patient presents with intermittent chest pressure and very mild shortness of breath. With patient being post pregnancy discussed slightly increased risk of blood clots and cardiomyopathy. Screening cardiac workup unremarkable in the ER. EKG unremarkable. Bedside ultrasound normal ejection fraction no effusion. Patient improved with Tylenol. On reassessment discussed further workup such as CT angiogram patient comfortable holding at this time strict reasons return given and patient will follow-up with cardiology and primary doctor and Tuesday. D-dimer negative patient low risk clinically.  Results and differential diagnosis were discussed with the patient/parent/guardian. Xrays were independently reviewed by myself.  Close follow up outpatient was discussed, comfortable with the plan.   Medications    acetaminophen (TYLENOL) tablet 1,000 mg (1,000 mg Oral Given 11/05/16 0240)    Vitals:   11/05/16 0245 11/05/16 0300 11/05/16 0315 11/05/16 0330  BP:      Pulse: 63 (!) 59 (!) 57 64  Resp: 18 14 13 22   Temp:      TempSrc:      SpO2: 99% 100% 97% 100%  Weight:      Height:        Final diagnoses:  Nonspecific chest pain  Essential hypertension     Final Clinical Impressions(s) / ED Diagnoses   Final diagnoses:  Nonspecific chest pain  Essential hypertension    New Prescriptions New Prescriptions   No medications on file     Elnora Morrison, MD 11/05/16 [phone]

## 2016-11-11 ENCOUNTER — Encounter (HOSPITAL_BASED_OUTPATIENT_CLINIC_OR_DEPARTMENT_OTHER): Payer: Self-pay | Admitting: Emergency Medicine

## 2016-11-11 ENCOUNTER — Emergency Department (HOSPITAL_BASED_OUTPATIENT_CLINIC_OR_DEPARTMENT_OTHER): Payer: Self-pay

## 2016-11-11 DIAGNOSIS — I10 Essential (primary) hypertension: Secondary | ICD-10-CM | POA: Insufficient documentation

## 2016-11-11 LAB — BASIC METABOLIC PANEL
Anion gap: 9 (ref 5–15)
BUN: 8 mg/dL (ref 6–20)
CHLORIDE: 103 mmol/L (ref 101–111)
CO2: 26 mmol/L (ref 22–32)
Calcium: 9.3 mg/dL (ref 8.9–10.3)
Creatinine, Ser: 0.67 mg/dL (ref 0.44–1.00)
GFR calc Af Amer: >60 mL/min (ref >60)
GFR calc non Af Amer: >60 mL/min (ref >60)
Glucose, Bld: 93 mg/dL (ref 65–99)
Potassium: 3 mmol/L — ABNORMAL LOW (ref 3.5–5.1)
SODIUM: 138 mmol/L (ref 135–145)

## 2016-11-11 LAB — CBC
HEMATOCRIT: 36 % (ref 36.0–46.0)
Hemoglobin: 12.1 g/dL (ref 12.0–15.0)
MCH: 26.7 pg (ref 26.0–34.0)
MCHC: 33.6 g/dL (ref 30.0–36.0)
MCV: 79.5 fL (ref 78.0–100.0)
Platelets: 337 10*3/uL (ref 150–400)
RBC: 4.53 MIL/uL (ref 3.87–5.11)
RDW: 16.2 % — AB (ref 11.5–15.5)
WBC: 5.1 10*3/uL (ref 4.0–10.5)

## 2016-11-11 LAB — TROPONIN I: Troponin I: <0.03 ng/mL (ref <0.03)

## 2016-11-11 LAB — PREGNANCY, URINE: Preg Test, Ur: NEGATIVE

## 2016-11-11 NOTE — ED Triage Notes (Addendum)
Patient states that she has ahd chest pain to the left side of her chest x 2 -3 days. Reports today that he right arm has started to go numb. The patient was seen here on the 14th for the same, and then also seen yesterday by a PA and was started on lavabo. Reports that she is having tightness in her neck.

## 2016-11-12 ENCOUNTER — Emergency Department (HOSPITAL_BASED_OUTPATIENT_CLINIC_OR_DEPARTMENT_OTHER): Payer: Self-pay

## 2016-11-12 ENCOUNTER — Emergency Department (HOSPITAL_BASED_OUTPATIENT_CLINIC_OR_DEPARTMENT_OTHER)
Admission: EM | Admit: 2016-11-12 | Discharge: 2016-11-12 | Disposition: A | Discharge status: Home / Self Care | Payer: Self-pay | Attending: Emergency Medicine | Admitting: Emergency Medicine

## 2016-11-12 DIAGNOSIS — I10 Essential (primary) hypertension: Secondary | ICD-10-CM

## 2016-11-12 MED ORDER — GI COCKTAIL ~~LOC~~
30.0000 mL | Freq: Once | ORAL | Status: AC
  Administered 2016-11-12: 30 mL via ORAL
  Filled 2016-11-12: qty 30

## 2016-11-12 MED ORDER — POTASSIUM CHLORIDE CRYS ER 20 MEQ PO TBCR
40.0000 meq | EXTENDED_RELEASE_TABLET | Freq: Once | ORAL | Status: AC
  Administered 2016-11-12: 40 meq via ORAL
  Filled 2016-11-12: qty 2

## 2016-11-12 NOTE — ED Notes (Signed)
EDP notified of pt's CT angio completed yesterday through [facility].

## 2016-11-12 NOTE — ED Provider Notes (Signed)
[facility] Provider Note   CSN: [id number] Arrival date & time: 11/11/16  2052     History   Chief Complaint Chief Complaint  Patient presents with  . Chest Pain    HPI Beth Walker is a 36 y.o. female.  The history is provided by the patient. No language interpreter was used.  Chest Pain   The current episode started more than 1 week ago. The problem occurs constantly. The problem has been gradually worsening. The pain is associated with rest. The pain is present in the substernal region. The pain is moderate. Radiates to: now feels like her neck is swollen anteriorly. Pertinent negatives include no abdominal pain, no back pain, no claudication, no cough, no diaphoresis, no exertional chest pressure, no fever, no headaches, no malaise/fatigue, no nausea, no orthopnea, no palpitations and no sputum production.  Seen in our ED multiple times for same.  Seen by [facility] and had a CT angio in the last 24 hours negative for PE, negative for dissection, negative for PNA, negative for chf.    Past Medical History:  Diagnosis Date  . Fibroids    history  . Hypertension   . Infection    UTI  . SVD (spontaneous vaginal delivery)    x 3  . Termination of pregnancy 2002   x 1  . UTI (lower urinary tract infection)    just finished abx for uti - 7 day treatment    Patient Active Problem List   Diagnosis Date Noted  . Positive GBS test 08/23/2016  . Elderly multigravida delivered 08/23/2016  . 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 SVD (spontaneous vaginal delivery) 08/22/2016  . Hx of Fibroids in past 04/22/2013    Past Surgical History:  Procedure Laterality Date  . DILATION AND CURETTAGE OF UTERUS    . DILATION AND EVACUATION N/A 04/23/2013   Procedure: DILATATION AND EVACUATION;  Surgeon: Betsy Coder, MD;  Location: [facility];  Service: Gynecology;  Laterality: N/A;  . WISDOM TOOTH EXTRACTION      OB History    Gravida Para Term Preterm AB  Living   7 4 4   3 4    SAB TAB Ectopic Multiple Live Births   2 1   0 4       Home Medications    Prior to Admission medications   Medication Sig Start Date End Date Taking? Authorizing Provider  ferrous sulfate 325 (65 FE) MG tablet Take 1 tablet (325 mg total) by mouth 2 (two) times daily with a meal. Patient not taking: Reported on 11/03/2016 08/23/16   Farrel Gordon, CNM  ferrous sulfate 325 (65 FE) MG tablet Take 1 tablet by mouth 2 (two) times daily. 08/23/16   Historical Provider, MD  ibuprofen (ADVIL,MOTRIN) 600 MG tablet Take 1 tablet (600 mg total) by mouth every 6 (six) hours as needed for fever, headache, mild pain, moderate pain or cramping. Patient not taking: Reported on 11/03/2016 08/23/16   Farrel Gordon, CNM  labetalol (NORMODYNE) 200 MG tablet Take 200 mg by mouth 2 (two) times daily. 10/29/16 11/28/16  Historical Provider, MD  naproxen (NAPROSYN) 500 MG tablet Take 1 tablet (500 mg total) by mouth 2 (two) times daily. 11/03/16   Shary Decamp, PA-C    Family History Family History  Problem Relation Age of Onset  . Hypertension Mother   . Hypertension Father   . Cancer Maternal Grandmother   . Stroke Paternal Grandmother   . Diabetes  Paternal Grandfather     Social History Social History  Substance Use Topics  . Smoking status: Never Smoker  . Smokeless tobacco: Never Used  . Alcohol use Yes     Comment: socially but none with pregnancy     Allergies   Pineapple   Review of Systems Review of Systems  Constitutional: Negative for diaphoresis, fever and malaise/fatigue.  Respiratory: Negative for cough and sputum production.   Cardiovascular: Positive for chest pain. Negative for palpitations, orthopnea, claudication and leg swelling.  Gastrointestinal: Negative for abdominal pain and nausea.  Musculoskeletal: Negative for back pain.  Neurological: Negative for headaches.  All other systems reviewed and are negative.    Physical Exam Updated Vital  Signs BP (!) 170/108 (BP Location: Right Arm)   Pulse 60   Temp 97.8 F (36.6 C) (Oral)   Resp 15   Ht 5' 5.5" (1.664 m)   Wt 164 lb (74.4 kg)   SpO2 100%   BMI 26.88 kg/m   Physical Exam  Constitutional: She is oriented to person, place, and time. She appears well-developed and well-nourished. No distress.  HENT:  Head: Normocephalic and atraumatic.  Mouth/Throat: No oropharyngeal exudate.  No swelling or the mouth, mallempati class 1  Eyes: Conjunctivae and EOM are normal. Pupils are equal, round, and reactive to light.  Neck: Trachea normal, normal range of motion and phonation normal. Neck supple. No JVD present. No tracheal tenderness, no spinous process tenderness and no muscular tenderness present. Carotid bruit is not present. No neck rigidity. No tracheal deviation, no edema, no erythema and normal range of motion present. No Brudzinski's sign and no Kernig's sign noted. No thyromegaly present.  The neck was examined and is not swollen, there is no thyromegaly  Cardiovascular: Normal rate, regular rhythm, normal heart sounds and intact distal pulses.  Exam reveals no gallop and no friction rub.   No murmur heard. Pulmonary/Chest: Effort normal and breath sounds normal. No stridor. She has no wheezes. She has no rales.  Abdominal: Soft. Bowel sounds are normal. She exhibits no mass. There is no tenderness. There is no rebound and no guarding.  Musculoskeletal: Normal range of motion. She exhibits no edema or tenderness.  Lymphadenopathy:    She has no cervical adenopathy.  Neurological: She is alert and oriented to person, place, and time.  Skin: Skin is warm and dry. Capillary refill takes less than 2 seconds.  Psychiatric: Her mood appears anxious.     ED Treatments / Results   Vitals:   11/11/16 2058 11/12/16 0226  BP: (!) 178/107 (!) 170/108  Pulse: 64 60  Resp: 18 15  Temp: 97.6 F (36.4 C) 97.8 F (36.6 C)   Results for orders placed or performed during the  hospital encounter of [id number]  Basic metabolic panel  Result Value Ref Range   Sodium 138 135 - 145 mmol/L   Potassium 3.0 (L) 3.5 - 5.1 mmol/L   Chloride 103 101 - 111 mmol/L   CO2 26 22 - 32 mmol/L   Glucose, Bld 93 65 - 99 mg/dL   BUN 8 6 - 20 mg/dL   Creatinine, Ser 0.67 0.44 - 1.00 mg/dL   Calcium 9.3 8.9 - 10.3 mg/dL   GFR calc non Af Amer >60 >60 mL/min   GFR calc Af Amer >60 >60 mL/min   Anion gap 9 5 - 15  CBC  Result Value Ref Range   WBC 5.1 4.0 - 10.5 K/uL   RBC 4.53 3.87 -  5.11 MIL/uL   Hemoglobin 12.1 12.0 - 15.0 g/dL   HCT 36.0 36.0 - 46.0 %   MCV 79.5 78.0 - 100.0 fL   MCH 26.7 26.0 - 34.0 pg   MCHC 33.6 30.0 - 36.0 g/dL   RDW 16.2 (H) 11.5 - 15.5 %   Platelets 337 150 - 400 K/uL  Troponin I  Result Value Ref Range   Troponin I <0.03 <0.03 ng/mL  Pregnancy, urine  Result Value Ref Range   Preg Test, Ur NEGATIVE NEGATIVE   Dg Chest 2 View  Result Date: 11/11/2016 CLINICAL DATA:  Acute onset of chest pain today. EXAM: CHEST  2 VIEW COMPARISON:  Radiographs 11/05/2016, 11/02/2016 and 10/29/2016 FINDINGS: The cardiomediastinal contours are normal. The lungs are clear. Pulmonary vasculature is normal. No consolidation, pleural effusion, or pneumothorax. No acute osseous abnormalities are seen. IMPRESSION: No active cardiopulmonary disease. Electronically Signed   By: Jeb Levering M.D.   On: 11/11/2016 23:06   Dg Chest 2 View  Result Date: 11/05/2016 CLINICAL DATA:  Acute onset of lower sternal chest pain and shortness of breath. Initial encounter. EXAM: CHEST  2 VIEW COMPARISON:  Chest radiograph performed 11/02/2016 FINDINGS: The lungs are well-aerated and clear. There is no evidence of focal opacification, pleural effusion or pneumothorax. The heart is normal in size; the mediastinal contour is within normal limits. No acute osseous abnormalities are seen. IMPRESSION: No acute cardiopulmonary process seen. Electronically Signed   By: Garald Balding M.D.   On:  11/05/2016 01:58   Dg Chest 2 View  Result Date: 11/02/2016 CLINICAL DATA:  Chronic worsening generalized chest pain. Initial encounter. EXAM: CHEST  2 VIEW COMPARISON:  Chest radiograph performed 10/29/2016 FINDINGS: The lungs are well-aerated and clear. There is no evidence of focal opacification, pleural effusion or pneumothorax. The heart is normal in size; the mediastinal contour is within normal limits. No acute osseous abnormalities are seen. IMPRESSION: No acute cardiopulmonary process seen. Electronically Signed   By: Garald Balding M.D.   On: 11/02/2016 21:04   EKG  EKG Interpretation  Date/Time:  Saturday November 11 2016 21:00:40 EST Ventricular Rate:  61 PR Interval:  140 QRS Duration: 90 QT Interval:  436 QTC Calculation: 438 R Axis:   71 Text Interpretation:  Normal sinus rhythm Normal ECG since last tracing no significant change Confirmed by BELFI  MD, MELANIE ([id number]) on 11/11/2016 9:29:15 PM       Radiology Dg Chest 2 View  Result Date: 11/11/2016 CLINICAL DATA:  Acute onset of chest pain today. EXAM: CHEST  2 VIEW COMPARISON:  Radiographs 11/05/2016, 11/02/2016 and 10/29/2016 FINDINGS: The cardiomediastinal contours are normal. The lungs are clear. Pulmonary vasculature is normal. No consolidation, pleural effusion, or pneumothorax. No acute osseous abnormalities are seen. IMPRESSION: No active cardiopulmonary disease. Electronically Signed   By: Jeb Levering M.D.   On: 11/11/2016 23:06    Procedures Procedures (including critical care time)  Medications Ordered in ED Medications  gi cocktail (Maalox,Lidocaine,Donnatal) (not administered)    PERC negative.  HEART score is 1 ruled out for MI in the ED.   Final Clinical Impressions(s) / ED Diagnoses  Non cardiac CP:  Patient had negative angio within 24 hours.  Ruled out again for MI in the ED>  I suspect much of this is anxiety related to her BP diagnosis.  Follow up with cardiology as an outpatient.  And your  PMD. Neck is not swollen there is no fluid in her neck or chest.  Patient  is well appearing.   Continue your medication.  All questions answered to patient's satisfaction. Based on history and exam patient has been appropriately medically screened and emergency conditions excluded. Patient is stable for discharge at this time. Strict return precautions given for any further episodes, persistent fever, weakness or any concerns. New Prescriptions New Prescriptions   No medications on file     Charie Pinkus, MD 11/12/16 [phone]

## 2018-03-12 ENCOUNTER — Other Ambulatory Visit: Payer: Self-pay | Admitting: Obstetrics and Gynecology

## 2018-03-25 NOTE — Patient Instructions (Addendum)
  Your procedure is scheduled on:  Monday, Jun 17  Enter through the Main Entrance of [facility] at:  10 am  Pick up the phone at the desk and dial [phone].  Call this number if you have problems the morning of surgery: [phone].  Remember: Do NOT eat food or Do NOT drink clear liquids (including water) after midnight Sunday.  Take these medicines the morning of surgery with a SIP OF WATER:  None  Brush your teeth on day of surgery.  Do Not smoke on the day of surgery.  Stop herbal medications, vitamin supplements, ibuprofen/NSAID 1 week prior to surgery.  Do NOT wear jewelry (body piercing), metal hair clips/bobby pins, make-up, or nail polish. Do NOT wear lotions, powders, or perfumes.  You may wear deoderant. Do NOT shave for 48 hours prior to surgery. Do NOT bring valuables to the hospital. Contacts may not be worn into surgery.  Have a responsible adult drive you home and stay with you for 24 hours after your procedure.  Home with husband Beth Walker cell [phone].

## 2018-03-27 ENCOUNTER — Other Ambulatory Visit: Payer: Self-pay

## 2018-03-27 ENCOUNTER — Encounter (HOSPITAL_COMMUNITY): Payer: Self-pay | Admitting: *Deleted

## 2018-03-27 ENCOUNTER — Encounter (HOSPITAL_COMMUNITY)
Admission: RE | Admit: 2018-03-27 | Discharge: 2018-03-27 | Disposition: A | Discharge status: Home / Self Care | Payer: 59 | Source: Ambulatory Visit | Attending: Obstetrics and Gynecology | Admitting: Obstetrics and Gynecology

## 2018-03-27 DIAGNOSIS — Z01812 Encounter for preprocedural laboratory examination: Secondary | ICD-10-CM | POA: Insufficient documentation

## 2018-03-27 HISTORY — DX: Anemia, unspecified: D64.9

## 2018-03-27 LAB — CBC
HEMATOCRIT: 36.8 % (ref 36.0–46.0)
Hemoglobin: 12.4 g/dL (ref 12.0–15.0)
MCH: 29.3 pg (ref 26.0–34.0)
MCHC: 33.7 g/dL (ref 30.0–36.0)
MCV: 87 fL (ref 78.0–100.0)
PLATELETS: 270 10*3/uL (ref 150–400)
RBC: 4.23 MIL/uL (ref 3.87–5.11)
RDW: 12.3 % (ref 11.5–15.5)
WBC: 4.9 10*3/uL (ref 4.0–10.5)

## 2018-03-28 NOTE — H&P (Addendum)
Beth Walker is a 37 y.o. female  P: 4-0-3-4, who presents for tubal sterilization.  The patient has had four vaginal deliveries and three miscarriages and she states that she has completed her childbearing.  In the past she has used condoms, oral contraceptives, the Mirena IUD and contraceptive Patch but now wants a permanent method.  Her menstrual flow  is monthly for 6 days with pad change every 3 hours (for hygiene).  Though she has some cramping and lower back pain with her menses (rated 5/10 on a 10 point pain scale) she does not take any analgesia.  She denies any problems with bowel or bladder function and does not have any vaginitis symptoms or dyspareunia.  The patient has consented to proceed with bilateral salpingectomy for permanent sterilization.  Past Medical History none but recently has had some elevated BPs and placed on HCTZ by her PCP  OB History: G: 7,  P: 4-0-3-4; SVB: 2000, 2004, 2011 and 2017  GYN History: menarche: 37 YO    LMP: 03/23/2018    Contracepton: condoms    Has a history of chlamydia but denies any history of abnormal PAP smears.   Last PAP smear: 2019-normal with negative HPV  Medical History: Hypertension, Uterine Fibroids, Vitamin D Deficiency,  Vertigo and Anemis  Surgical History: 2014 [facility] Denies problems with anesthesia or history of blood transfusions  Family History: Hypertension, Aneurysms, Breast Cancer, Cervical Cancer, Stroke, Migraine, Diabetes Mellitus, Renal Disease and Heart Disease  Social History: Married and employed with [employer];  Denies tobacco use and occasionally uses alcohol   Medications: Hydrochlorothiazide  25 mg daily Vitamin D 50, 000 units weekly Ibuprofen 800 mg  with food every 8 hours as needed  Allergies  Allergen Reactions  . Pineapple Itching, Swelling and Other (See Comments)    Reaction:  Tongue swelling    Denies sensitivity to peanuts, shellfish, soy, latex or adhesives.  ROS: Admits to contact lenses  but denies headache, vision changes, nasal congestion, dysphagia, tinnitus, dizziness, hoarseness, cough,  chest pain, shortness of breath, nausea, vomiting, diarrhea,constipation,  urinary frequency, urgency  dysuria, hematuria, vaginitis symptoms, pelvic pain, swelling of joints,easy bruising,  myalgias, arthralgias, skin rashes, unexplained weight loss and except as is mentioned in the history of present illness, patient's review of systems is otherwise negative.     Physical Exam  Bp: 142/90   P: 72 bpm   Temperature: 98.6 degree F orally      Weight: 183 lbs.  Height: 5'6.5"  BMI: 29.1       O2Sat. 99% (room air)  Neck: supple without masses or thyromegaly Lungs: clear to auscultation Heart: regular rate and rhythm Abdomen: soft, non-tender and no organomegaly Pelvic:EGBUS- wnl; vagina-normal rugae; uterus-normal size, cervix without lesions or motion tenderness; adnexae-no tenderness or masses Extremities:  no clubbing, cyanosis or edema   Assesment: Desire for Sterilization   Disposition: Reviewed the risks of surgery to include, but not limited to: reaction to anesthesia, damage to adjacent organs, infection and excessive bleeding. The patient verbalized understanding of these risks and has consented to proceed with Tubal Sterilization at [facility] on April 08, 2018 at 11:30 a.m.  Pt will f/u BP with PCP as instructed.  Questions answered.  Consent s/w.    CSN# [id number]   [place] J. Florene Glen, PA-C  for Dr. Harvie Bridge. Mancel Bale

## 2018-04-08 ENCOUNTER — Ambulatory Visit (HOSPITAL_COMMUNITY): Payer: 59 | Admitting: Certified Registered Nurse Anesthetist

## 2018-04-08 ENCOUNTER — Other Ambulatory Visit: Payer: Self-pay

## 2018-04-08 ENCOUNTER — Encounter (HOSPITAL_COMMUNITY)
Admission: AD | Disposition: A | Discharge status: Home / Self Care | Payer: Self-pay | Source: Ambulatory Visit | Attending: Obstetrics and Gynecology

## 2018-04-08 ENCOUNTER — Encounter (HOSPITAL_COMMUNITY): Payer: Self-pay

## 2018-04-08 ENCOUNTER — Ambulatory Visit (HOSPITAL_COMMUNITY)
Admission: AD | Admit: 2018-04-08 | Discharge: 2018-04-08 | Disposition: A | Discharge status: Home / Self Care | Payer: 59 | Source: Ambulatory Visit | Attending: Obstetrics and Gynecology | Admitting: Obstetrics and Gynecology

## 2018-04-08 DIAGNOSIS — E559 Vitamin D deficiency, unspecified: Secondary | ICD-10-CM | POA: Diagnosis not present

## 2018-04-08 DIAGNOSIS — Z302 Encounter for sterilization: Secondary | ICD-10-CM | POA: Diagnosis not present

## 2018-04-08 DIAGNOSIS — E669 Obesity, unspecified: Secondary | ICD-10-CM | POA: Diagnosis not present

## 2018-04-08 DIAGNOSIS — I10 Essential (primary) hypertension: Secondary | ICD-10-CM | POA: Insufficient documentation

## 2018-04-08 DIAGNOSIS — Z6830 Body mass index (BMI) 30.0-30.9, adult: Secondary | ICD-10-CM | POA: Insufficient documentation

## 2018-04-08 HISTORY — PX: LAPAROSCOPIC TUBAL LIGATION: SHX1937

## 2018-04-08 LAB — PREGNANCY, URINE: PREG TEST UR: NEGATIVE

## 2018-04-08 SURGERY — LIGATION, FALLOPIAN TUBE, LAPAROSCOPIC
Anesthesia: General | Site: Abdomen | Laterality: Bilateral

## 2018-04-08 MED ORDER — PROPOFOL 10 MG/ML IV BOLUS
INTRAVENOUS | Status: AC
  Filled 2018-04-08: qty 20

## 2018-04-08 MED ORDER — MIDAZOLAM HCL 2 MG/2ML IJ SOLN
INTRAMUSCULAR | Status: AC
  Filled 2018-04-08: qty 2

## 2018-04-08 MED ORDER — IBUPROFEN 600 MG PO TABS: 600.0000 mg | ORAL_TABLET | Freq: Four times a day (QID) | ORAL | 0 refills | Status: AC | PRN

## 2018-04-08 MED ORDER — LIDOCAINE HCL (CARDIAC) PF 100 MG/5ML IV SOSY
PREFILLED_SYRINGE | INTRAVENOUS | Status: DC | PRN
  Administered 2018-04-08: 100 mg via INTRAVENOUS

## 2018-04-08 MED ORDER — BUPIVACAINE HCL (PF) 0.25 % IJ SOLN
INTRAMUSCULAR | Status: AC
  Filled 2018-04-08: qty 30

## 2018-04-08 MED ORDER — ONDANSETRON HCL 4 MG/2ML IJ SOLN
INTRAMUSCULAR | Status: DC | PRN
  Administered 2018-04-08 (×2): 4 mg via INTRAVENOUS

## 2018-04-08 MED ORDER — MIDAZOLAM HCL 2 MG/2ML IJ SOLN
INTRAMUSCULAR | Status: DC | PRN
  Administered 2018-04-08: 1.5 mg via INTRAVENOUS

## 2018-04-08 MED ORDER — FENTANYL CITRATE (PF) 250 MCG/5ML IJ SOLN
INTRAMUSCULAR | Status: AC
  Filled 2018-04-08: qty 5

## 2018-04-08 MED ORDER — CEFAZOLIN SODIUM-DEXTROSE 2-4 GM/100ML-% IV SOLN
2.0000 g | INTRAVENOUS | Status: AC
  Administered 2018-04-08: 2 g via INTRAVENOUS

## 2018-04-08 MED ORDER — DEXAMETHASONE SODIUM PHOSPHATE 10 MG/ML IJ SOLN
INTRAMUSCULAR | Status: DC | PRN
Start: 2018-04-08 — End: 2018-04-08
  Administered 2018-04-08: 10 mg via INTRAVENOUS

## 2018-04-08 MED ORDER — SUGAMMADEX SODIUM 200 MG/2ML IV SOLN
INTRAVENOUS | Status: DC | PRN
  Administered 2018-04-08: 200 mg via INTRAVENOUS

## 2018-04-08 MED ORDER — LIDOCAINE HCL (CARDIAC) PF 100 MG/5ML IV SOSY
PREFILLED_SYRINGE | INTRAVENOUS | Status: AC
  Filled 2018-04-08: qty 5

## 2018-04-08 MED ORDER — SCOPOLAMINE 1 MG/3DAYS TD PT72
1.0000 | MEDICATED_PATCH | Freq: Once | TRANSDERMAL | Status: DC
  Administered 2018-04-08: 1.5 mg via TRANSDERMAL

## 2018-04-08 MED ORDER — HYDROMORPHONE HCL 1 MG/ML IJ SOLN: 0.2500 mg | INTRAMUSCULAR | Status: DC | PRN

## 2018-04-08 MED ORDER — METOCLOPRAMIDE HCL 5 MG/ML IJ SOLN
INTRAMUSCULAR | Status: AC
  Filled 2018-04-08: qty 2

## 2018-04-08 MED ORDER — OXYCODONE-ACETAMINOPHEN 5-325 MG PO TABS: 1.0000 | ORAL_TABLET | Freq: Four times a day (QID) | ORAL | 0 refills | Status: AC | PRN

## 2018-04-08 MED ORDER — DEXAMETHASONE SODIUM PHOSPHATE 10 MG/ML IJ SOLN
INTRAMUSCULAR | Status: AC
Start: 2018-04-08 — End: ?
  Filled 2018-04-08: qty 1

## 2018-04-08 MED ORDER — GLYCOPYRROLATE 0.2 MG/ML IJ SOLN
INTRAMUSCULAR | Status: AC
  Filled 2018-04-08: qty 1

## 2018-04-08 MED ORDER — HYDROCODONE-ACETAMINOPHEN 7.5-325 MG PO TABS: 1.0000 | ORAL_TABLET | Freq: Once | ORAL | Status: DC | PRN

## 2018-04-08 MED ORDER — SUGAMMADEX SODIUM 200 MG/2ML IV SOLN
INTRAVENOUS | Status: AC
  Filled 2018-04-08: qty 2

## 2018-04-08 MED ORDER — ROCURONIUM BROMIDE 100 MG/10ML IV SOLN
INTRAVENOUS | Status: DC | PRN
  Administered 2018-04-08: 40 mg via INTRAVENOUS

## 2018-04-08 MED ORDER — MEPERIDINE HCL 25 MG/ML IJ SOLN: 6.2500 mg | INTRAMUSCULAR | Status: DC | PRN

## 2018-04-08 MED ORDER — GLYCOPYRROLATE 0.2 MG/ML IJ SOLN
INTRAMUSCULAR | Status: DC | PRN
  Administered 2018-04-08: 0.2 mg via INTRAVENOUS

## 2018-04-08 MED ORDER — ONDANSETRON HCL 4 MG/2ML IJ SOLN
INTRAMUSCULAR | Status: AC
  Filled 2018-04-08: qty 2

## 2018-04-08 MED ORDER — FENTANYL CITRATE (PF) 100 MCG/2ML IJ SOLN
INTRAMUSCULAR | Status: DC | PRN
  Administered 2018-04-08 (×2): 100 ug via INTRAVENOUS

## 2018-04-08 MED ORDER — KETOROLAC TROMETHAMINE 30 MG/ML IJ SOLN
INTRAMUSCULAR | Status: AC
  Filled 2018-04-08: qty 1

## 2018-04-08 MED ORDER — BUPIVACAINE HCL (PF) 0.25 % IJ SOLN
INTRAMUSCULAR | Status: DC | PRN
  Administered 2018-04-08: 10 mL

## 2018-04-08 MED ORDER — SCOPOLAMINE 1 MG/3DAYS TD PT72
MEDICATED_PATCH | TRANSDERMAL | Status: AC
  Administered 2018-04-08: 1.5 mg via TRANSDERMAL
  Filled 2018-04-08: qty 1

## 2018-04-08 MED ORDER — LACTATED RINGERS IV SOLN
INTRAVENOUS | Status: DC
  Administered 2018-04-08: 12:00:00 via INTRAVENOUS
  Administered 2018-04-08: 125 mL/h via INTRAVENOUS

## 2018-04-08 MED ORDER — KETOROLAC TROMETHAMINE 30 MG/ML IJ SOLN
INTRAMUSCULAR | Status: DC | PRN
  Administered 2018-04-08: 30 mg via INTRAVENOUS

## 2018-04-08 MED ORDER — ROCURONIUM BROMIDE 100 MG/10ML IV SOLN
INTRAVENOUS | Status: AC
  Filled 2018-04-08: qty 1

## 2018-04-08 MED ORDER — PROPOFOL 10 MG/ML IV BOLUS
INTRAVENOUS | Status: DC | PRN
  Administered 2018-04-08: 200 mg via INTRAVENOUS

## 2018-04-08 MED ORDER — METOCLOPRAMIDE HCL 5 MG/ML IJ SOLN
10.0000 mg | Freq: Once | INTRAMUSCULAR | Status: AC | PRN
  Administered 2018-04-08: 10 mg via INTRAVENOUS

## 2018-04-08 SURGICAL SUPPLY — 26 items
ADH SKN CLS APL DERMABOND .7 (GAUZE/BANDAGES/DRESSINGS) ×1
CATH ROBINSON RED A/P 16FR (CATHETERS) ×2 IMPLANT
DERMABOND ADVANCED (GAUZE/BANDAGES/DRESSINGS) ×1
DERMABOND ADVANCED .7 DNX12 (GAUZE/BANDAGES/DRESSINGS) ×1 IMPLANT
DRSG OPSITE POSTOP 3X4 (GAUZE/BANDAGES/DRESSINGS) ×2 IMPLANT
DURAPREP 26ML APPLICATOR (WOUND CARE) ×2 IMPLANT
FILTER SMOKE EVAC LAPAROSHD (FILTER) IMPLANT
GLOVE BIO SURGEON STRL SZ7.5 (GLOVE) ×2 IMPLANT
GLOVE BIOGEL PI IND STRL 7.0 (GLOVE) ×2 IMPLANT
GLOVE BIOGEL PI IND STRL 7.5 (GLOVE) ×1 IMPLANT
GLOVE BIOGEL PI INDICATOR 7.0 (GLOVE) ×2
GLOVE BIOGEL PI INDICATOR 7.5 (GLOVE) ×1
GOWN STRL REUS W/TWL LRG LVL3 (GOWN DISPOSABLE) ×4 IMPLANT
NEEDLE HYPO 22GX1.5 SAFETY (NEEDLE) ×2 IMPLANT
NEEDLE INSUFFLATION 120MM (ENDOMECHANICALS) ×2 IMPLANT
PACK LAPAROSCOPY BASIN (CUSTOM PROCEDURE TRAY) ×2 IMPLANT
PACK TRENDGUARD 450 HYBRID PRO (MISCELLANEOUS) ×1 IMPLANT
PROTECTOR NERVE ULNAR (MISCELLANEOUS) ×4 IMPLANT
SLEEVE XCEL OPT CAN 5 100 (ENDOMECHANICALS) ×2 IMPLANT
SUT MNCRL AB 3-0 PS2 27 (SUTURE) ×2 IMPLANT
SUT VICRYL 0 UR6 27IN ABS (SUTURE) ×2 IMPLANT
TOWEL OR 17X24 6PK STRL BLUE (TOWEL DISPOSABLE) ×4 IMPLANT
TRENDGUARD 450 HYBRID PRO PACK (MISCELLANEOUS) ×2
TROCAR XCEL NON-BLD 11X100MML (ENDOMECHANICALS) ×2 IMPLANT
TROCAR XCEL NON-BLD 5MMX100MML (ENDOMECHANICALS) ×2 IMPLANT
WARMER LAPAROSCOPE (MISCELLANEOUS) ×2 IMPLANT

## 2018-04-08 NOTE — Anesthesia Preprocedure Evaluation (Signed)
Anesthesia Evaluation  Patient identified by MRN, date of birth, ID band Patient awake    Reviewed: Allergy & Precautions, NPO status , Patient's Chart, lab work & pertinent test results  Airway Mallampati: II  TM Distance: >3 FB Neck ROM: Full    Dental no notable dental hx. (+) Teeth Intact   Pulmonary neg pulmonary ROS,    Pulmonary exam normal breath sounds clear to auscultation       Cardiovascular hypertension, Normal cardiovascular exam Rhythm:Regular Rate:Normal  Not currently on any Rx   Neuro/Psych negative neurological ROS  negative psych ROS   GI/Hepatic negative GI ROS, Neg liver ROS,   Endo/Other  Obesity  Renal/GU negative Renal ROS  negative genitourinary   Musculoskeletal   Abdominal (+) + obese,   Peds  Hematology  (+) anemia ,   Anesthesia Other Findings   Reproductive/Obstetrics Undesired fertility                             Anesthesia Physical Anesthesia Plan  ASA: II  Anesthesia Plan: General   Post-op Pain Management:    Induction: Intravenous  PONV Risk Score and Plan: 4 or greater and Scopolamine patch - Pre-op, Midazolam, Dexamethasone, Ondansetron and Treatment may vary due to age or medical condition  Airway Management Planned: Oral ETT  Additional Equipment:   Intra-op Plan:   Post-operative Plan: Extubation in OR  Informed Consent: I have reviewed the patients History and Physical, chart, labs and discussed the procedure including the risks, benefits and alternatives for the proposed anesthesia with the patient or authorized representative who has indicated his/her understanding and acceptance.   Dental advisory given  Plan Discussed with: CRNA and Surgeon  Anesthesia Plan Comments:         Anesthesia Quick Evaluation

## 2018-04-08 NOTE — Op Note (Signed)
Preop Diagnosis: Desire for surgical Sterilization   Postop Diagnosis: desire for surgical sterilization   Procedure: LAPAROSCOPIC TUBAL LIGATION   Anesthesia: General   Anesthesiologist: Josephine Igo, MD   Attending: Everett Graff, MD   Assistant:  Windell Norfolk Tech  Findings: Nl appearing bilateral ovaries and tubes  Pathology: N/a  Fluids: 1200 cc  UOP: 25 cc  EBL: 5 cc  Complications: None  Procedure: The patient was taken to the operating room after the risks, benefits, alternatives, complications, treatment options, and expected outcomes were discussed with the patient. The patient verbalized understanding, the patient concurred with the proposed plan and consent signed and witnessed. The patient was taken to the Operating Room, identified as Beth Walker and the procedure verified as laparoscopic bilateral tubal fulguration/sterilization. A Time Out was held and the above information confirmed.  The patient was placed under general anesthesia per anesthesia staff, the patient was placed in modified dorsal lithotomy position and was prepped, draped, and catheterized in the normal, sterile fashion.  The cervix was visualized and an intrauterine manipulator was placed. A  10 mm umbilical incision was then performed. Veress needle was passed and pneumoperitoneum was established. A 10 mm trocar was advanced into the intraabdominal cavity, the operative laparoscope was introduced and findings as noted above.  The left fallopian tube was identified and carried out to its fimbriated end and cauterized for at least consecutive burns in the isthmic portion of the tube.  The same was done on the contralateral side.  The laparoscope was removed and pneumoperitoneum released.  The fascia was repaired with an interrupted stitch of 0 vicryl and the skin was reapproximated with 3-0 monocryl via subcuticular stitch and dermabond was applied.  Sponge, lap and needle count were correct.   Patient tolerated the procedure well and was returned to the recovery room in good condition.

## 2018-04-08 NOTE — Anesthesia Postprocedure Evaluation (Signed)
Anesthesia Post Note  Patient: Beth Walker  Procedure(s) Performed: LAPAROSCOPIC TUBAL LIGATION (Bilateral Abdomen)     Patient location during evaluation: PACU Anesthesia Type: General Level of consciousness: awake and alert and oriented Pain management: pain level controlled Vital Signs Assessment: post-procedure vital signs reviewed and stable Respiratory status: spontaneous breathing, nonlabored ventilation and respiratory function stable Cardiovascular status: blood pressure returned to baseline and stable Postop Assessment: no apparent nausea or vomiting Anesthetic complications: no    Last Vitals:  Vitals:   04/08/18 1415 04/08/18 1430  BP: 118/74 116/68  Pulse: 61 (!) 56  Resp: 10 10  Temp:    SpO2: 97% 100%    Last Pain:  Vitals:   04/08/18 1415  TempSrc:   PainSc: 0-No pain   Pain Goal: Patients Stated Pain Goal: 3 (04/08/18 1034)               Djimon Lundstrom A.

## 2018-04-08 NOTE — Interval H&P Note (Signed)
History and Physical Interval Note:  04/08/2018 11:18 AM  Beth Walker  has presented today for surgery, with the diagnosis of Desire for surgical Sterilization  The various methods of treatment have been discussed with the patient and family. After consideration of risks, benefits and other options for treatment, the patient has consented to  Procedure(s): LAPAROSCOPIC TUBAL LIGATION (Bilateral)/FULGURATION as a surgical intervention .  The patient's history has been reviewed, patient examined, no change in status, stable for surgery.  I have reviewed the patient's chart and labs.  Questions were answered to the patient's satisfaction.     [name]

## 2018-04-08 NOTE — Discharge Instructions (Signed)

## 2018-04-08 NOTE — Transfer of Care (Signed)
Immediate Anesthesia Transfer of Care Note  Patient: Beth Walker  Procedure(s) Performed: LAPAROSCOPIC TUBAL LIGATION (Bilateral Abdomen)  Patient Location: PACU  Anesthesia Type:General  Level of Consciousness: sedated  Airway & Oxygen Therapy: Patient Spontanous Breathing and Patient connected to nasal cannula oxygen  Post-op Assessment: Report given to RN  Post vital signs: Reviewed and stable  Last Vitals:  Vitals Value Taken Time  BP    Temp    Pulse    Resp    SpO2      Last Pain:  Vitals:   04/08/18 1034  TempSrc: Oral  PainSc: 0-No pain      Patients Stated Pain Goal: 3 (70/01/74 9449)  Complications: No apparent anesthesia complications

## 2018-04-08 NOTE — Anesthesia Procedure Notes (Signed)
Procedure Name: Intubation Date/Time: 04/08/2018 11:52 AM Performed by: Josephine Igo, MD Pre-anesthesia Checklist: Patient identified, Patient being monitored, Timeout performed, Emergency Drugs available and Suction available Patient Re-evaluated:Patient Re-evaluated prior to induction Oxygen Delivery Method: Circle System Utilized Preoxygenation: Pre-oxygenation with 100% oxygen Induction Type: IV induction Ventilation: Mask ventilation without difficulty Laryngoscope Size: Mac and 3 Grade View: Grade II Tube type: Oral Tube size: 7.0 mm Number of attempts: 1 Airway Equipment and Method: stylet Placement Confirmation: ETT inserted through vocal cords under direct vision,  positive ETCO2 and breath sounds checked- equal and bilateral Secured at: 21 cm Tube secured with: Tape Dental Injury: Teeth and Oropharynx as per pre-operative assessment

## 2018-04-09 ENCOUNTER — Encounter (HOSPITAL_COMMUNITY): Payer: Self-pay | Admitting: Obstetrics and Gynecology

## 2018-06-07 ENCOUNTER — Ambulatory Visit: Payer: 59 | Admitting: Family Medicine

## 2021-05-28 ENCOUNTER — Emergency Department (HOSPITAL_BASED_OUTPATIENT_CLINIC_OR_DEPARTMENT_OTHER): Payer: 59

## 2021-05-28 ENCOUNTER — Emergency Department (HOSPITAL_COMMUNITY)
Admission: EM | Admit: 2021-05-28 | Discharge: 2021-05-29 | Disposition: A | Discharge status: Home / Self Care | Payer: 59 | Attending: Emergency Medicine | Admitting: Emergency Medicine

## 2021-05-28 ENCOUNTER — Encounter (HOSPITAL_BASED_OUTPATIENT_CLINIC_OR_DEPARTMENT_OTHER): Payer: Self-pay | Admitting: *Deleted

## 2021-05-28 ENCOUNTER — Other Ambulatory Visit: Payer: Self-pay

## 2021-05-28 DIAGNOSIS — E876 Hypokalemia: Secondary | ICD-10-CM | POA: Insufficient documentation

## 2021-05-28 DIAGNOSIS — R7303 Prediabetes: Secondary | ICD-10-CM | POA: Insufficient documentation

## 2021-05-28 DIAGNOSIS — I10 Essential (primary) hypertension: Secondary | ICD-10-CM | POA: Insufficient documentation

## 2021-05-28 DIAGNOSIS — Z79899 Other long term (current) drug therapy: Secondary | ICD-10-CM | POA: Diagnosis not present

## 2021-05-28 DIAGNOSIS — R079 Chest pain, unspecified: Secondary | ICD-10-CM | POA: Insufficient documentation

## 2021-05-28 DIAGNOSIS — R030 Elevated blood-pressure reading, without diagnosis of hypertension: Secondary | ICD-10-CM | POA: Insufficient documentation

## 2021-05-28 DIAGNOSIS — R0789 Other chest pain: Secondary | ICD-10-CM | POA: Insufficient documentation

## 2021-05-28 LAB — CBC
HCT: 39.7 % (ref 36.0–46.0)
Hemoglobin: 12.7 g/dL (ref 12.0–15.0)
MCH: 26 pg (ref 26.0–34.0)
MCHC: 32 g/dL (ref 30.0–36.0)
MCV: 81.2 fL (ref 80.0–100.0)
Platelets: 339 10*3/uL (ref 150–400)
RBC: 4.89 MIL/uL (ref 3.87–5.11)
RDW: 15.4 % (ref 11.5–15.5)
WBC: 6.8 10*3/uL (ref 4.0–10.5)
nRBC: 0 % (ref 0.0–0.2)

## 2021-05-28 LAB — BASIC METABOLIC PANEL
Anion gap: 11 (ref 5–15)
BUN: 6 mg/dL (ref 6–20)
CO2: 26 mmol/L (ref 22–32)
Calcium: 9.7 mg/dL (ref 8.9–10.3)
Chloride: 97 mmol/L — ABNORMAL LOW (ref 98–111)
Creatinine, Ser: 0.67 mg/dL (ref 0.44–1.00)
GFR, Estimated: >60 mL/min (ref >60)
Glucose, Bld: 112 mg/dL — ABNORMAL HIGH (ref 70–99)
Potassium: 3.4 mmol/L — ABNORMAL LOW (ref 3.5–5.1)
Sodium: 134 mmol/L — ABNORMAL LOW (ref 135–145)

## 2021-05-28 LAB — TROPONIN I (HIGH SENSITIVITY): Troponin I (High Sensitivity): 3 ng/L (ref <18)

## 2021-05-28 NOTE — ED Triage Notes (Signed)
Pt presents with chest pain since last night. "Heavy", also heaviness in left arm. C/o feeling lightheaded and endorses nausea

## 2021-05-28 NOTE — ED Notes (Signed)
Patient transported to X-ray 

## 2021-05-28 NOTE — ED Notes (Signed)
Pt was advised to stay but she she still decided to leave and be seen somewhere else.

## 2021-05-29 ENCOUNTER — Emergency Department (HOSPITAL_BASED_OUTPATIENT_CLINIC_OR_DEPARTMENT_OTHER)
Admission: EM | Admit: 2021-05-29 | Discharge: 2021-05-29 | Disposition: A | Discharge status: Home / Self Care | Payer: 59 | Source: Home / Self Care | Attending: Emergency Medicine | Admitting: Emergency Medicine

## 2021-05-29 DIAGNOSIS — I10 Essential (primary) hypertension: Secondary | ICD-10-CM

## 2021-05-29 DIAGNOSIS — E876 Hypokalemia: Secondary | ICD-10-CM

## 2021-05-29 DIAGNOSIS — R0789 Other chest pain: Secondary | ICD-10-CM

## 2021-05-29 MED ORDER — POTASSIUM CHLORIDE CRYS ER 20 MEQ PO TBCR: 20.0000 meq | EXTENDED_RELEASE_TABLET | Freq: Two times a day (BID) | ORAL | 0 refills | Status: AC

## 2021-05-29 MED ORDER — POTASSIUM CHLORIDE CRYS ER 20 MEQ PO TBCR
40.0000 meq | EXTENDED_RELEASE_TABLET | Freq: Once | ORAL | Status: AC
  Administered 2021-05-29: 40 meq via ORAL
  Filled 2021-05-29: qty 2

## 2021-05-29 MED ORDER — IBUPROFEN 800 MG PO TABS
800.0000 mg | ORAL_TABLET | Freq: Once | ORAL | Status: AC
  Administered 2021-05-29: 800 mg via ORAL
  Filled 2021-05-29: qty 1

## 2021-05-29 NOTE — ED Provider Notes (Signed)
[facility] DEPARTMENT Provider Note   CSN: [id number] Arrival date & time: 05/28/21  2219     History Chief Complaint  Patient presents with   Chest Pain    Beth Walker is a 40 y.o. female.  The history is provided by the patient.  Chest Pain She has history of hypertension and prediabetes and comes in because of pain in her left arm and left side of her chest which started yesterday.  Pain is a dull pain which initially was worse with movement of her left arm.  Is also worse when she presses on her chest where she lays prone on her chest.  Pain was initially rated at 8/10, now is down to 6/10.  She denies any dyspnea, nausea, diaphoresis.  She denies any unusual activity or trauma.  She did have similar symptoms several years ago in the postpartum period without a diagnosis being made.  She is a non-smoker and denies history of hyperlipidemia.  There is no family history of premature coronary atherosclerosis.  Of note, she had been on hydrochlorothiazide, but stopped taking that to see if lifestyle changes would control her blood pressure.   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   SVD (spontaneous vaginal delivery) 08/22/2016   Hx of Fibroids in past 04/22/2013    Past Surgical History:  Procedure Laterality Date   DILATION AND EVACUATION N/A 04/23/2013   Procedure: DILATATION AND EVACUATION;  Surgeon: Betsy Coder, MD;  Location: [facility];  Service:  Gynecology;  Laterality: N/A;   LAPAROSCOPIC TUBAL LIGATION Bilateral 04/08/2018   Procedure: LAPAROSCOPIC TUBAL LIGATION;  Surgeon: Everett Graff, MD;  Location: [facility];  Service: Gynecology;  Laterality: Bilateral;   WISDOM TOOTH EXTRACTION       OB History     Gravida  7   Para  4   Term  4   Preterm      AB  3   Living  4      SAB  2   IAB  1   Ectopic      Multiple  0   Live Births  4           Family History  Problem Relation Age of Onset   Hypertension Mother    Hypertension Father    Cancer Maternal Grandmother    Stroke Paternal Grandmother    Diabetes Paternal Grandfather     Social History   Tobacco Use   Smoking status: Never   Smokeless tobacco: Never  Vaping Use   Vaping Use: Never used  Substance Use Topics   Alcohol use: Not Currently   Drug use: No    Home Medications Prior to Admission medications   Medication Sig Start Date End Date Taking? Authorizing Provider  potassium chloride SA (KLOR-CON) 20 MEQ tablet Take 1  tablet (20 mEq total) by mouth 2 (two) times daily. [id number]  Yes Delora Fuel, MD  ibuprofen (ADVIL,MOTRIN) 600 MG tablet Take 1 tablet (600 mg total) by mouth every 6 (six) hours as needed for mild pain, moderate pain or cramping. 04/08/18   Everett Graff, MD  Naphazoline HCl (CLEAR EYES OP) Place 1 drop into both eyes daily as needed (dry eyes).    [provider]  oxyCODONE-acetaminophen (PERCOCET) 5-325 MG tablet Take 1-2 tablets by mouth every 6 (six) hours as needed for severe pain. 04/08/18   Everett Graff, MD    Allergies    Pineapple  Review of Systems   Review of Systems  Cardiovascular:  Positive for chest pain.  All other systems reviewed and are negative.  Physical Exam Updated Vital Signs BP (!) 147/95 (BP Location: Left Arm)   Pulse (!) 53   Temp 98 F (36.7 C) (Oral)   Resp 17   Ht '5\' 6"'$  (1.676 m)   Wt 81.6 kg   LMP 05/23/2021   SpO2 100%   BMI 29.05 kg/m   Physical  Exam Vitals and nursing note reviewed.  40 year old female, resting comfortably and in no acute distress. Vital signs are significant for elevated blood pressure and slightly slow heart rate. Oxygen saturation is 100%, which is normal. Head is normocephalic and atraumatic. PERRLA, EOMI. Oropharynx is clear. Neck is nontender and supple without adenopathy or JVD. Back is nontender and there is no CVA tenderness. Lungs are clear without rales, wheezes, or rhonchi. Chest is mildly tender in the left anterior chest wall without any crepitus.  Pain is reproduced by shoulder abduction against resistance. Heart has regular rate and rhythm without murmur. Abdomen is soft, flat, nontender without masses or hepatosplenomegaly and peristalsis is normoactive. Extremities have no cyanosis or edema, full range of motion is present. Skin is warm and dry without rash. Neurologic: Mental status is normal, cranial nerves are intact, there are no motor or sensory deficits.  ED Results / Procedures / Treatments   Labs (all labs ordered are listed, but only abnormal results are displayed) Labs Reviewed  BASIC METABOLIC PANEL - Abnormal; Notable for the following components:      Result Value   Sodium 134 (*)    Potassium 3.4 (*)    Chloride 97 (*)    Glucose, Bld 112 (*)    All other components within normal limits  CBC  PREGNANCY, URINE  TROPONIN I (HIGH SENSITIVITY)  TROPONIN I (HIGH SENSITIVITY)    EKG EKG Interpretation  Date/Time:  Saturday May 28 2021 22:27:45 EDT Ventricular Rate:  64 PR Interval:  144 QRS Duration: 90 QT Interval:  428 QTC Calculation: 441 R Axis:   83 Text Interpretation: Normal sinus rhythm Normal ECG When compared with ECG of 11/11/2016, No significant change was found Confirmed by Delora Fuel [id number]) on 05/29/2021 12:04:09 AM  Radiology DG Chest 2 View  Result Date: 05/28/2021 CLINICAL DATA:  Chest pain, heaviness and left arm EXAM: CHEST - 2 VIEW COMPARISON:   Radiograph 09/29/2019, 11/11/2016 FINDINGS: No consolidation, features of edema, pneumothorax, or effusion. Pulmonary vascularity is normally distributed. The cardiomediastinal contours are unremarkable. No acute osseous or soft tissue abnormality. Thoracolumbar dextrocurvature similar to priors. IMPRESSION: No acute cardiopulmonary abnormality. Electronically Signed   By: Lovena Le M.D.   On: 05/28/2021 22:54    Procedures Procedures   Medications Ordered in ED Medications  potassium chloride SA (KLOR-CON) CR tablet 40 mEq (has no administration in  time range)  ibuprofen (ADVIL) tablet 800 mg (has no administration in time range)    ED Course  I have reviewed the triage vital signs and the nursing notes.  Pertinent labs & imaging results that were available during my care of the patient were reviewed by me and considered in my medical decision making (see chart for details).   MDM Rules/Calculators/A&P                         Chest pain which appears to be musculoskeletal.  ECG is normal, troponin is normal, chest x-ray is normal.  Labs are significant for mild hypokalemia.  Patient risk score on heart pathway is 2, which puts her at low risk for major adverse cardiac events.  She is advised to apply ice, use over-the-counter NSAIDs and acetaminophen as needed for pain.  She is discharged with prescription for K-Dur.  Recommended that she monitor her blood pressure at home.  Final Clinical Impression(s) / ED Diagnoses Final diagnoses:  Musculoskeletal chest pain  Hypokalemia  Elevated blood pressure reading with diagnosis of hypertension    Rx / DC Orders ED Discharge Orders          Ordered    potassium chloride SA (KLOR-CON) 20 MEQ tablet  2 times daily        05/29/21 [id number]             Delora Fuel, MD [id number] [phone]

## 2021-05-29 NOTE — Discharge Instructions (Addendum)
Apply ice for 30 minutes at a time, 4 times a day.  Take ibuprofen (Advil, Motrin) or naproxen (Aleve) as needed for pain.  If you need additional pain relief, add acetaminophen.  Combining acetaminophen with ibuprofen or naproxen gives you better pain relief than either medication by itself.  Please check your blood pressure once a day and keep a record of it.  Take that record with you when you see your primary care provider.  It is important that your blood pressure is kept in an appropriate range.  Inadequately controlled blood pressure can lead to heart attacks, strokes, kidney failure.

## 2021-05-29 NOTE — ED Notes (Signed)
Pt is at [facility]

## 2021-07-05 ENCOUNTER — Other Ambulatory Visit: Payer: Self-pay | Admitting: Obstetrics and Gynecology

## 2021-07-05 DIAGNOSIS — E049 Nontoxic goiter, unspecified: Secondary | ICD-10-CM

## 2021-07-08 ENCOUNTER — Ambulatory Visit
Admission: RE | Admit: 2021-07-08 | Discharge: 2021-07-08 | Disposition: A | Discharge status: Home / Self Care | Payer: 59 | Source: Ambulatory Visit | Attending: Obstetrics and Gynecology | Admitting: Obstetrics and Gynecology

## 2021-07-08 DIAGNOSIS — E049 Nontoxic goiter, unspecified: Secondary | ICD-10-CM

## 2021-08-02 ENCOUNTER — Other Ambulatory Visit: Payer: Self-pay | Admitting: Obstetrics and Gynecology

## 2021-08-02 DIAGNOSIS — R928 Other abnormal and inconclusive findings on diagnostic imaging of breast: Secondary | ICD-10-CM

## 2021-08-23 ENCOUNTER — Other Ambulatory Visit: Payer: Self-pay | Admitting: Obstetrics and Gynecology

## 2021-08-23 ENCOUNTER — Other Ambulatory Visit: Payer: Self-pay

## 2021-08-23 ENCOUNTER — Ambulatory Visit
Admission: RE | Admit: 2021-08-23 | Discharge: 2021-08-23 | Disposition: A | Discharge status: Home / Self Care | Payer: 59 | Source: Ambulatory Visit | Attending: Obstetrics and Gynecology | Admitting: Obstetrics and Gynecology

## 2021-08-23 ENCOUNTER — Ambulatory Visit: Payer: 59

## 2021-08-23 DIAGNOSIS — R928 Other abnormal and inconclusive findings on diagnostic imaging of breast: Secondary | ICD-10-CM

## 2021-08-23 DIAGNOSIS — R921 Mammographic calcification found on diagnostic imaging of breast: Secondary | ICD-10-CM

## 2021-09-05 ENCOUNTER — Ambulatory Visit
Admission: RE | Admit: 2021-09-05 | Discharge: 2021-09-05 | Disposition: A | Discharge status: Home / Self Care | Payer: 59 | Source: Ambulatory Visit | Attending: Obstetrics and Gynecology | Admitting: Obstetrics and Gynecology

## 2021-09-05 ENCOUNTER — Other Ambulatory Visit: Payer: Self-pay | Admitting: Obstetrics and Gynecology

## 2021-09-05 ENCOUNTER — Other Ambulatory Visit: Payer: Self-pay

## 2021-09-05 DIAGNOSIS — R921 Mammographic calcification found on diagnostic imaging of breast: Secondary | ICD-10-CM

## 2022-04-06 IMAGING — MG MM BREAST LOCALIZATION CLIP
4 series · 4 of 12 positions shown · non-contrast
Comparison: Previous exam(s).

CLINICAL DATA: Evaluate biopsy clip

EXAM:
3D DIAGNOSTIC RIGHT MAMMOGRAM POST STEREOTACTIC BIOPSY

[R ML synth-2D]
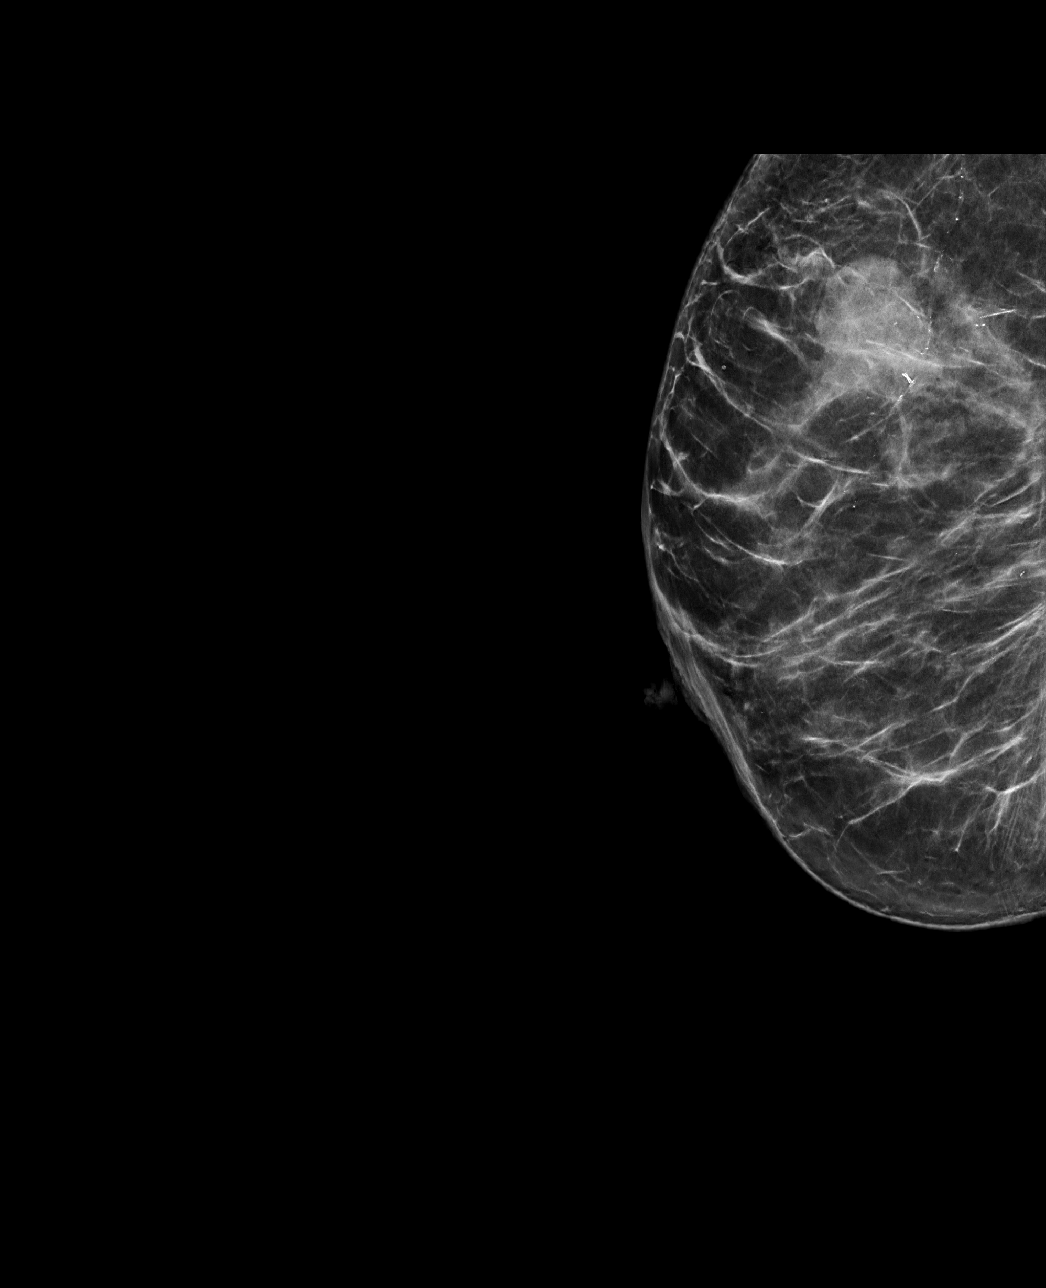

[R CC synth-2D]
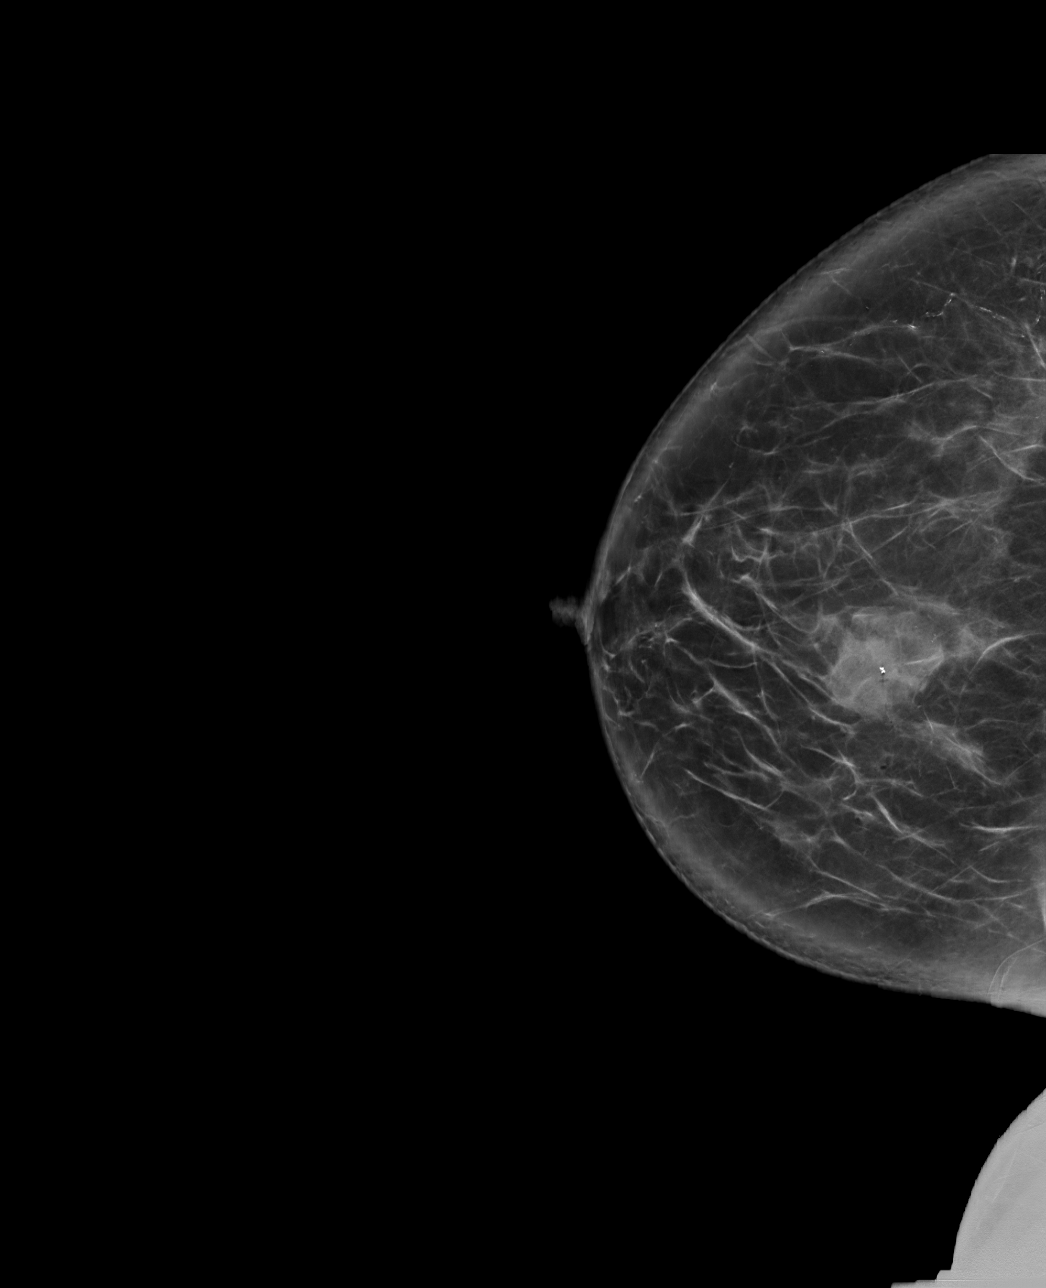

[R ML tomo · tomo slice 38/75.0]
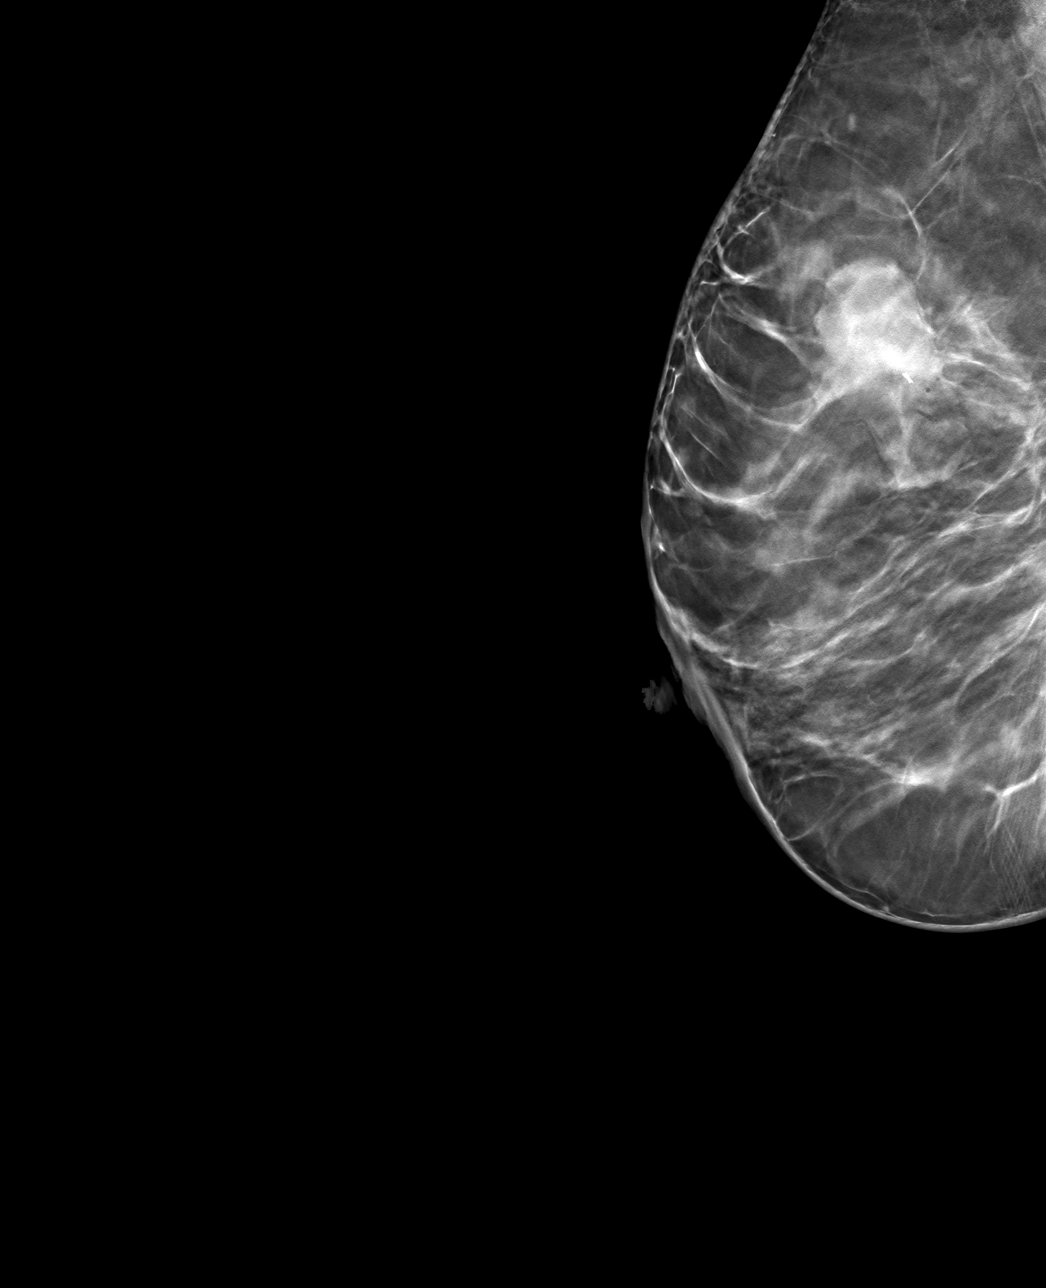

[R CC tomo · tomo slice 42/83.0]
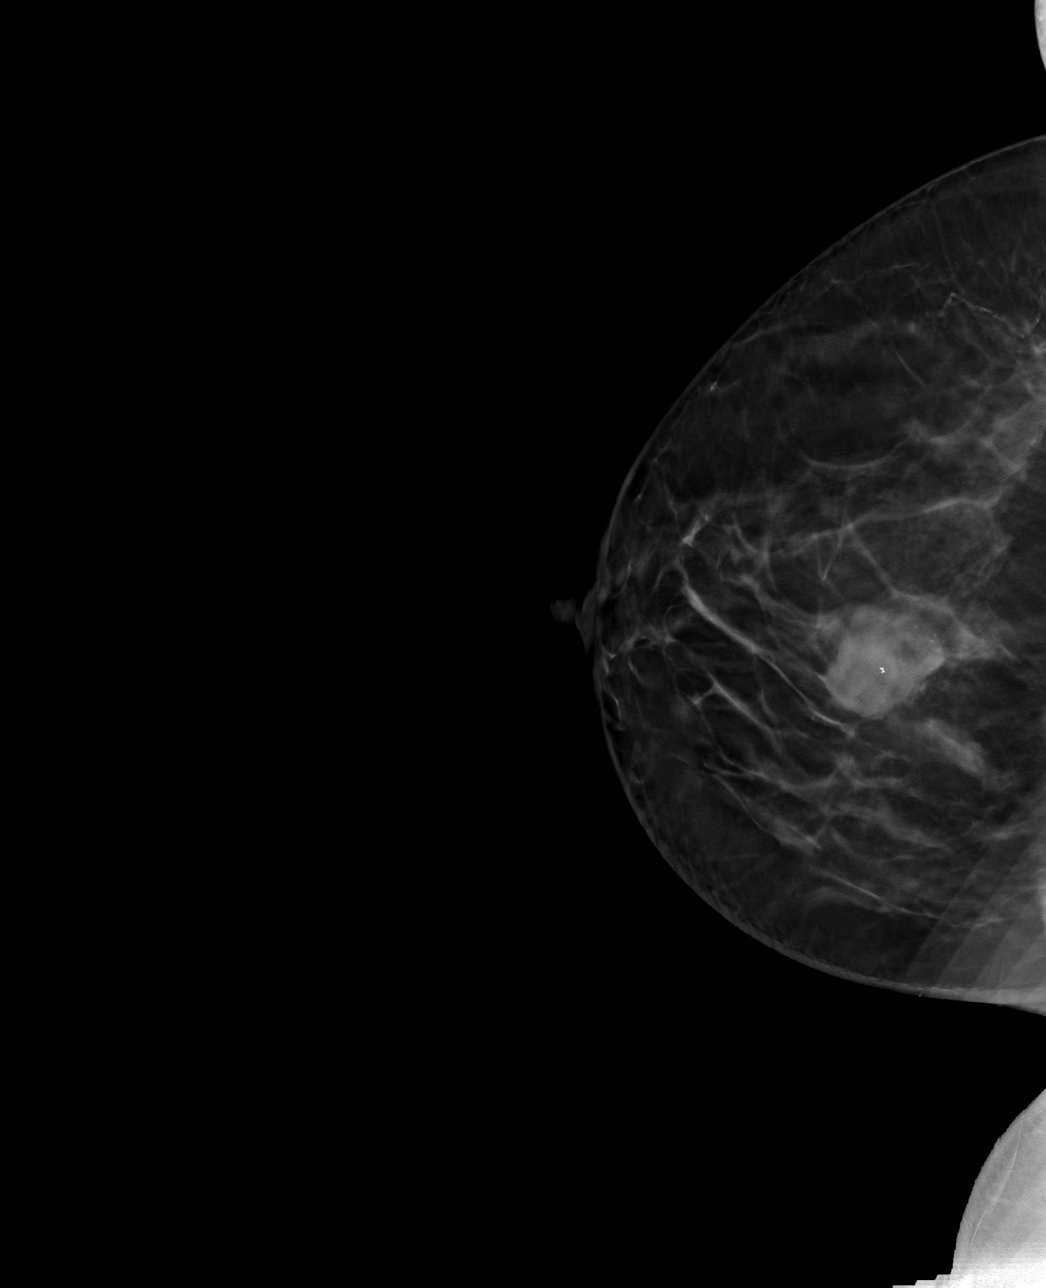

[4 of 12 positions shown; findings below may reference images not displayed]

FINDINGS: 3D Mammographic images were obtained following stereotactic guided
biopsy of right breast calcifications. The biopsy marking clip is in
expected position at the site of biopsy.
IMPRESSION: Appropriate positioning of the X shaped biopsy marking clip at the
site of biopsy in the location of the biopsied calcifications. There
is a 2.7 cm hematoma at the biopsy site.

Final Assessment: Post Procedure Mammograms for Marker Placement

## 2022-05-01 ENCOUNTER — Emergency Department (HOSPITAL_BASED_OUTPATIENT_CLINIC_OR_DEPARTMENT_OTHER): Payer: 59

## 2022-05-01 ENCOUNTER — Other Ambulatory Visit: Payer: Self-pay

## 2022-05-01 ENCOUNTER — Encounter (HOSPITAL_BASED_OUTPATIENT_CLINIC_OR_DEPARTMENT_OTHER): Payer: Self-pay

## 2022-05-01 ENCOUNTER — Emergency Department (HOSPITAL_BASED_OUTPATIENT_CLINIC_OR_DEPARTMENT_OTHER)
Admission: EM | Admit: 2022-05-01 | Discharge: 2022-05-01 | Disposition: A | Discharge status: Home / Self Care | Payer: 59 | Attending: Emergency Medicine | Admitting: Emergency Medicine

## 2022-05-01 DIAGNOSIS — I10 Essential (primary) hypertension: Secondary | ICD-10-CM | POA: Insufficient documentation

## 2022-05-01 DIAGNOSIS — M792 Neuralgia and neuritis, unspecified: Secondary | ICD-10-CM

## 2022-05-01 DIAGNOSIS — R079 Chest pain, unspecified: Secondary | ICD-10-CM

## 2022-05-01 DIAGNOSIS — M5410 Radiculopathy, site unspecified: Secondary | ICD-10-CM | POA: Insufficient documentation

## 2022-05-01 LAB — CBC
HCT: 36.9 % (ref 36.0–46.0)
Hemoglobin: 12.1 g/dL (ref 12.0–15.0)
MCH: 27.4 pg (ref 26.0–34.0)
MCHC: 32.8 g/dL (ref 30.0–36.0)
MCV: 83.7 fL (ref 80.0–100.0)
Platelets: 307 10*3/uL (ref 150–400)
RBC: 4.41 MIL/uL (ref 3.87–5.11)
RDW: 13.1 % (ref 11.5–15.5)
WBC: 7 10*3/uL (ref 4.0–10.5)
nRBC: 0 % (ref 0.0–0.2)

## 2022-05-01 LAB — BASIC METABOLIC PANEL
Anion gap: 7 (ref 5–15)
BUN: 7 mg/dL (ref 6–20)
CO2: 26 mmol/L (ref 22–32)
Calcium: 8.9 mg/dL (ref 8.9–10.3)
Chloride: 102 mmol/L (ref 98–111)
Creatinine, Ser: 0.66 mg/dL (ref 0.44–1.00)
GFR, Estimated: >60 mL/min (ref >60)
Glucose, Bld: 90 mg/dL (ref 70–99)
Potassium: 3.6 mmol/L (ref 3.5–5.1)
Sodium: 135 mmol/L (ref 135–145)

## 2022-05-01 LAB — TROPONIN I (HIGH SENSITIVITY)
Troponin I (High Sensitivity): 5 ng/L (ref <18)
Troponin I (High Sensitivity): 6 ng/L (ref <18)

## 2022-05-01 LAB — PREGNANCY, URINE: Preg Test, Ur: NEGATIVE

## 2022-05-01 MED ORDER — HYDROCHLOROTHIAZIDE 25 MG PO TABS
25.0000 mg | ORAL_TABLET | Freq: Once | ORAL | Status: AC
  Administered 2022-05-01: 25 mg via ORAL
  Filled 2022-05-01: qty 1

## 2022-05-01 MED ORDER — PREDNISONE 50 MG PO TABS: 50.0000 mg | ORAL_TABLET | Freq: Every day | ORAL | 0 refills | Status: AC

## 2022-05-01 MED ORDER — NAPROXEN 375 MG PO TABS
375.0000 mg | ORAL_TABLET | Freq: Two times a day (BID) | ORAL | 0 refills | Status: AC
Start: 2022-05-01 — End: ?

## 2022-05-01 MED ORDER — CYCLOBENZAPRINE HCL 10 MG PO TABS: 10.0000 mg | ORAL_TABLET | Freq: Two times a day (BID) | ORAL | 0 refills | Status: AC | PRN

## 2022-05-01 NOTE — ED Triage Notes (Signed)
Pt states she is having pain to left arm x 1 week, started in neck. Pain also in chest starting today. States arm went numb during the night.

## 2022-05-01 NOTE — ED Notes (Signed)
Pt. Reports she has had pain in her L arm and L neck for a few day and felt like today was getting worse.

## 2022-05-01 NOTE — Discharge Instructions (Addendum)
Take the medications as prescribed to help with the pain and discomfort in your arm.  Follow-up with your doctor to be rechecked

## 2022-05-01 NOTE — ED Provider Notes (Signed)
[facility] DEPARTMENT Provider Note   CSN: [id number] Arrival date & time: 05/01/22  1353     History  Chief Complaint  Patient presents with  . Arm Pain  . Chest Pain    Beth Walker is a 41 y.o. female.   Arm Pain Associated symptoms include chest pain.  Chest Pain   Patient has a history of hypertension, fibroids who presents with complaints of chest pain.  Patient states it initially started with pain in her neck about a week ago.  She felt like she just slept on it wrong.  It bothered her to turn her neck.  She had some aching discomfort and it went down towards her shoulder.  Patient states she has had some persistent aching down in her arm but then yesterday she had an episode of pain where it went into her chest.  This slowly resolved so she decided to just keep an eye on it.  She was fine overnight and went to work today but while at work had another episode of pain in her chest associated with her arm pain.  She decided to come to the ED to get evaluated.  Patient denies any history of heart disease.  No history of PE  Home Medications Prior to Admission medications   Medication Sig Start Date End Date Taking? Authorizing Provider  cyclobenzaprine (FLEXERIL) 10 MG tablet Take 1 tablet (10 mg total) by mouth 2 (two) times daily as needed for muscle spasms. 05/01/22  Yes Dorie Rank, MD  naproxen (NAPROSYN) 375 MG tablet Take 1 tablet (375 mg total) by mouth 2 (two) times daily. 05/01/22  Yes Dorie Rank, MD  predniSONE (DELTASONE) 50 MG tablet Take 1 tablet (50 mg total) by mouth daily. 05/01/22  Yes Dorie Rank, MD  ibuprofen (ADVIL,MOTRIN) 600 MG tablet Take 1 tablet (600 mg total) by mouth every 6 (six) hours as needed for mild pain, moderate pain or cramping. 04/08/18   Everett Graff, MD  Naphazoline HCl (CLEAR EYES OP) Place 1 drop into both eyes daily as needed (dry eyes).    [provider]  oxyCODONE-acetaminophen (PERCOCET) 5-325 MG  tablet Take 1-2 tablets by mouth every 6 (six) hours as needed for severe pain. 04/08/18   Everett Graff, MD  potassium chloride SA (KLOR-CON) 20 MEQ tablet Take 1 tablet (20 mEq total) by mouth 2 (two) times daily. 05/25/14   Delora Fuel, MD      Allergies    Pineapple    Review of Systems   Review of Systems  Cardiovascular:  Positive for chest pain.    Physical Exam Updated Vital Signs BP (!) 162/106   Pulse (!) 59   Temp 98.4 F (36.9 C) (Oral)   Resp (!) 23   Ht 1.676 m ('5\' 6"'$ )   Wt 81.2 kg   LMP 04/12/2022 (Approximate)   SpO2 100%   BMI 28.89 kg/m  Physical Exam Vitals and nursing note reviewed.  Constitutional:      General: She is not in acute distress.    Appearance: She is well-developed.  HENT:     Head: Normocephalic and atraumatic.     Right Ear: External ear normal.     Left Ear: External ear normal.  Eyes:     General: No scleral icterus.       Right eye: No discharge.        Left eye: No discharge.     Conjunctiva/sclera: Conjunctivae normal.  Neck:  Trachea: No tracheal deviation.     Comments: Mild ttp left paraspinal region Cardiovascular:     Rate and Rhythm: Normal rate and regular rhythm.  Pulmonary:     Effort: Pulmonary effort is normal. No respiratory distress.     Breath sounds: Normal breath sounds. No stridor. No wheezing or rales.  Abdominal:     General: Bowel sounds are normal. There is no distension.     Palpations: Abdomen is soft.     Tenderness: There is no abdominal tenderness. There is no guarding or rebound.  Musculoskeletal:        General: No tenderness or deformity.     Cervical back: Neck supple.  Skin:    General: Skin is warm and dry.     Findings: No rash.  Neurological:     General: No focal deficit present.     Mental Status: She is alert.     Cranial Nerves: No cranial nerve deficit (no facial droop, extraocular movements intact, no slurred speech).     Sensory: No sensory deficit.     Motor: No abnormal  muscle tone or seizure activity.     Coordination: Coordination normal.     Comments: Nl strength and sensation all 4 extremities  Psychiatric:        Mood and Affect: Mood normal.     ED Results / Procedures / Treatments   Labs (all labs ordered are listed, but only abnormal results are displayed) Labs Reviewed  BASIC METABOLIC PANEL  CBC  PREGNANCY, URINE  TROPONIN I (HIGH SENSITIVITY)  TROPONIN I (HIGH SENSITIVITY)    EKG EKG Interpretation  Date/Time:  Monday May 01 2022 14:05:15 EDT Ventricular Rate:  69 PR Interval:  144 QRS Duration: 88 QT Interval:  414 QTC Calculation: 443 R Axis:   73 Text Interpretation: Normal sinus rhythm Possible Left atrial enlargement Borderline ECG When compared with ECG of 28-May-2021 22:27, No significant change since last tracing Confirmed by Dorie Rank [phone]) on 05/01/2022 3:10:20 PM  Radiology DG Chest 2 View  Result Date: 05/01/2022 CLINICAL DATA:  Left arm pain for 1 week. Chest pain starting today. EXAM: CHEST - 2 VIEW COMPARISON:  Chest radiograph 05/28/2021 FINDINGS: The cardiomediastinal contours are within normal limits. The lungs are clear. No pneumothorax or pleural effusion. No acute finding in the visualized skeleton. IMPRESSION: No acute cardiopulmonary process. Electronically Signed   By: Audie Pinto M.D.   On: 05/01/2022 14:27    Procedures Procedures    Medications Ordered in ED Medications  hydrochlorothiazide (HYDRODIURIL) tablet 25 mg (25 mg Oral Given 05/01/22 1641)    ED Course/ Medical Decision Making/ A&P Clinical Course as of 05/01/22 1835  Mon May 01, 2022  1507 DG Chest 2 View [JK]  35 Cxr images and radiology report reviewed, no acute findings [JK]  1539 Blood pressure down to 382 systolic at the bedside.  Patient states she did not take her blood pressure medications this morning [JK]  5053 Basic metabolic panel Normal [JK]  1833 CBC Normal [JK]  1833 Troponin I (High Sensitivity) Serial  troponins normal [JK]  1833 Pregnancy, urine Negative [JK]    Clinical Course User Index [JK] Dorie Rank, MD                           Medical Decision Making Problems Addressed: Chest pain, unspecified type: acute illness or injury that poses a threat to life or bodily functions Radicular pain in  left arm: acute illness or injury  Amount and/or Complexity of Data Reviewed Labs: ordered.    Details: Serial troponins normal. Radiology: ordered. Decision-making details documented in ED Course.  Risk Prescription drug management.   Patient presented to the ED for evaluation of chest and arm pain.  Patient states initially symptoms started as discomfort in her neck.  She primarily has an ache in her left arm but she did have some pain in her chest today.  She denies any numbness or weakness.  No shortness of breath.  Overall low risk heart score.  Serial troponins normal.  Doubt ACS.  No shortness of breath.  No tachypnea PERC negative, doubt PE.  Patient does have pain rating to her arm.  Suspect cervical radiculopathy.  Normal strength and sensation.  No indication for emergent imaging or neurosurgery.  Will DC home on medications for pain and inflammation.  Outpatient follow-up with PCP         Final Clinical Impression(s) / ED Diagnoses Final diagnoses:  Radicular pain in left arm    Rx / DC Orders ED Discharge Orders          Ordered    predniSONE (DELTASONE) 50 MG tablet  Daily        05/01/22 1811    cyclobenzaprine (FLEXERIL) 10 MG tablet  2 times daily PRN        05/01/22 1811    naproxen (NAPROSYN) 375 MG tablet  2 times daily        05/01/22 1811              Dorie Rank, MD 05/01/22 [phone]
